# Patient Record
Sex: Female | Born: 1960
Health system: Southern US, Community
[De-identification: ages and names within clinical notes are randomized; demographics above are authoritative.]

## PROBLEM LIST (undated history)

## (undated) DIAGNOSIS — T7840XA Allergy, unspecified, initial encounter: Secondary | ICD-10-CM

## (undated) DIAGNOSIS — F419 Anxiety disorder, unspecified: Secondary | ICD-10-CM

## (undated) DIAGNOSIS — Z800 Family history of malignant neoplasm of digestive organs: Secondary | ICD-10-CM

## (undated) DIAGNOSIS — D126 Benign neoplasm of colon, unspecified: Secondary | ICD-10-CM

## (undated) DIAGNOSIS — E559 Vitamin D deficiency, unspecified: Secondary | ICD-10-CM

## (undated) DIAGNOSIS — Z205 Contact with and (suspected) exposure to viral hepatitis: Secondary | ICD-10-CM

## (undated) DIAGNOSIS — E781 Pure hyperglyceridemia: Secondary | ICD-10-CM

## (undated) HISTORY — DX: Contact with and (suspected) exposure to viral hepatitis: Z20.5

## (undated) HISTORY — DX: Allergy, unspecified, initial encounter: T78.40XA

## (undated) HISTORY — DX: Benign neoplasm of colon, unspecified: D12.6

## (undated) HISTORY — DX: Pure hyperglyceridemia: E78.1

## (undated) HISTORY — PX: COLONOSCOPY: SHX174

## (undated) HISTORY — DX: Vitamin D deficiency, unspecified: E55.9

## (undated) HISTORY — DX: Family history of malignant neoplasm of digestive organs: Z80.0

## (undated) HISTORY — DX: Anxiety disorder, unspecified: F41.9

---

## 1982-10-30 HISTORY — PX: CHOLECYSTECTOMY: SHX55

## 1999-04-15 ENCOUNTER — Encounter (INDEPENDENT_AMBULATORY_CARE_PROVIDER_SITE_OTHER): Payer: Self-pay

## 1999-04-15 ENCOUNTER — Other Ambulatory Visit: Admission: RE | Admit: 1999-04-15 | Discharge: 1999-04-15 | Payer: Self-pay | Admitting: Obstetrics and Gynecology

## 2002-10-29 ENCOUNTER — Encounter: Admission: RE | Admit: 2002-10-29 | Discharge: 2002-10-29 | Payer: Self-pay | Admitting: Family Medicine

## 2002-10-29 ENCOUNTER — Encounter: Payer: Self-pay | Admitting: Family Medicine

## 2003-07-31 ENCOUNTER — Encounter: Admission: RE | Admit: 2003-07-31 | Discharge: 2003-07-31 | Payer: Self-pay | Admitting: Family Medicine

## 2003-07-31 ENCOUNTER — Encounter: Payer: Self-pay | Admitting: Family Medicine

## 2005-01-18 ENCOUNTER — Other Ambulatory Visit: Admission: RE | Admit: 2005-01-18 | Discharge: 2005-01-18 | Payer: Self-pay | Admitting: Family Medicine

## 2006-07-18 ENCOUNTER — Ambulatory Visit: Payer: Self-pay | Admitting: Family Medicine

## 2006-11-13 ENCOUNTER — Ambulatory Visit: Payer: Self-pay | Admitting: Family Medicine

## 2007-02-14 ENCOUNTER — Ambulatory Visit: Payer: Self-pay | Admitting: Family Medicine

## 2007-03-21 ENCOUNTER — Other Ambulatory Visit: Admission: RE | Admit: 2007-03-21 | Discharge: 2007-03-21 | Payer: Self-pay | Admitting: Family Medicine

## 2007-03-21 ENCOUNTER — Ambulatory Visit: Payer: Self-pay | Admitting: Family Medicine

## 2007-05-06 ENCOUNTER — Ambulatory Visit: Payer: Self-pay | Admitting: Family Medicine

## 2007-06-11 ENCOUNTER — Ambulatory Visit: Payer: Self-pay | Admitting: Family Medicine

## 2007-06-19 ENCOUNTER — Ambulatory Visit: Payer: Self-pay | Admitting: Family Medicine

## 2007-11-07 ENCOUNTER — Ambulatory Visit: Payer: Self-pay | Admitting: Family Medicine

## 2007-12-31 ENCOUNTER — Ambulatory Visit: Payer: Self-pay | Admitting: Family Medicine

## 2008-03-02 ENCOUNTER — Ambulatory Visit: Payer: Self-pay | Admitting: Family Medicine

## 2008-04-06 ENCOUNTER — Ambulatory Visit: Payer: Self-pay | Admitting: Family Medicine

## 2008-04-06 ENCOUNTER — Other Ambulatory Visit: Admission: RE | Admit: 2008-04-06 | Discharge: 2008-04-06 | Payer: Self-pay | Admitting: Family Medicine

## 2008-10-27 ENCOUNTER — Ambulatory Visit: Payer: Self-pay | Admitting: Family Medicine

## 2008-12-02 ENCOUNTER — Ambulatory Visit: Payer: Self-pay | Admitting: Family Medicine

## 2009-05-30 DIAGNOSIS — E559 Vitamin D deficiency, unspecified: Secondary | ICD-10-CM

## 2009-05-30 HISTORY — DX: Vitamin D deficiency, unspecified: E55.9

## 2009-06-01 ENCOUNTER — Ambulatory Visit: Payer: Self-pay | Admitting: Family Medicine

## 2009-06-01 ENCOUNTER — Other Ambulatory Visit: Admission: RE | Admit: 2009-06-01 | Discharge: 2009-06-01 | Payer: Self-pay | Admitting: Family Medicine

## 2009-12-17 ENCOUNTER — Ambulatory Visit: Payer: Self-pay | Admitting: Family Medicine

## 2010-03-10 ENCOUNTER — Ambulatory Visit: Payer: Self-pay | Admitting: Family Medicine

## 2010-09-08 ENCOUNTER — Other Ambulatory Visit: Admission: RE | Admit: 2010-09-08 | Discharge: 2010-09-08 | Payer: Self-pay | Admitting: Family Medicine

## 2010-09-08 ENCOUNTER — Ambulatory Visit: Payer: Self-pay | Admitting: Family Medicine

## 2010-11-19 ENCOUNTER — Encounter: Payer: Self-pay | Admitting: Family Medicine

## 2011-01-05 ENCOUNTER — Ambulatory Visit: Payer: Self-pay | Admitting: Family Medicine

## 2011-04-05 ENCOUNTER — Encounter: Payer: Self-pay | Admitting: Family Medicine

## 2011-04-05 ENCOUNTER — Ambulatory Visit (INDEPENDENT_AMBULATORY_CARE_PROVIDER_SITE_OTHER): Payer: BC Managed Care – PPO | Admitting: Family Medicine

## 2011-04-05 VITALS — BP 128/80 | HR 80 | Ht 66.0 in | Wt 189.0 lb

## 2011-04-05 DIAGNOSIS — F411 Generalized anxiety disorder: Secondary | ICD-10-CM

## 2011-04-05 DIAGNOSIS — J45909 Unspecified asthma, uncomplicated: Secondary | ICD-10-CM

## 2011-04-05 DIAGNOSIS — J45998 Other asthma: Secondary | ICD-10-CM

## 2011-04-05 DIAGNOSIS — J309 Allergic rhinitis, unspecified: Secondary | ICD-10-CM | POA: Insufficient documentation

## 2011-04-05 DIAGNOSIS — E781 Pure hyperglyceridemia: Secondary | ICD-10-CM

## 2011-04-05 DIAGNOSIS — Z23 Encounter for immunization: Secondary | ICD-10-CM

## 2011-04-05 MED ORDER — BUDESONIDE-FORMOTEROL FUMARATE 160-4.5 MCG/ACT IN AERO: 2.0000 | INHALATION_SPRAY | Freq: Two times a day (BID) | RESPIRATORY_TRACT | Status: DC

## 2011-04-05 MED ORDER — ALPRAZOLAM 0.5 MG PO TABS: 0.5000 mg | ORAL_TABLET | Freq: Three times a day (TID) | ORAL | Status: DC | PRN

## 2011-04-05 NOTE — Progress Notes (Signed)
Subjective:    Patient ID: Hannah Werner, female    DOB: [birth year], 50 y.o.   MRN: [id number]  HPI Patient presents for f/u on her asthma.  She will be out of her Symbicort in the next few days.  Also needs refill on Singulair--chart was reviewed though and she was given rx for year supply in November.  She usually uses the Symbicort just 1 puff in the morning, but increases to BID during allergy season (ie now).  Hasn't needed to use her rescue inhaler in a month, when pollen counts were very high.    Anxiety is doing very well with the Zoloft.  Rarely needs alprazolam, last used it related to her mother's recent death.  Only has 1 tablet left and would like a refill.  Last rx was #30 in November.  Past Medical History  Diagnosis Date  . Asthma   . Unspecified vitamin D deficiency 05/2009  . Allergy     seasonal  . Pure hyperglyceridemia   . Anxiety     Past Surgical History  Procedure Date  . Cholecystectomy     History   Social History  . Marital Status: Married    Spouse Name: N/A    Number of Children: N/A  . Years of Education: N/A   Occupational History  . Not on file.   Social History Main Topics  . Smoking status: Never Smoker   . Smokeless tobacco: Never Used  . Alcohol Use: Yes     1 beer or mixed drink 3 times a week  . Drug Use: No  . Sexually Active: Yes    Birth Control/ Protection: Pill   Other Topics Concern  . Not on file   Social History Narrative  . No narrative on file    Family History  Problem Relation Age of Onset  . Diabetes Mother   . Cancer Mother 66    colon cancer  . Heart disease Father     CABG @69   . Hyperlipidemia Father   . Diabetes Sister   . Stroke Maternal Grandfather   . Asthma Neg Hx   . Diabetes Sister     Current outpatient prescriptions:Albuterol Sulfate (PROAIR HFA IN), Inhale 1-2 puffs into the lungs as needed.  , Disp: , Rfl: ;  ALPRAZolam (XANAX) 0.5 MG tablet, Take 0.5 mg by mouth as needed.  , Disp: ,  Rfl: ;  budesonide-formoterol (SYMBICORT) 160-4.5 MCG/ACT inhaler, Inhale 1 puff into the lungs 2 (two) times daily. , Disp: , Rfl: ;  montelukast (SINGULAIR) 10 MG tablet, Take 10 mg by mouth at bedtime.  , Disp: , Rfl:  norethindrone-ethinyl estradiol (TRIPHASIL) 0.5/0.75/1-35 MG-MCG per tablet, Take 1 tablet by mouth daily.  , Disp: , Rfl: ;  sertraline (ZOLOFT) 50 MG tablet, Take 50 mg by mouth daily.  , Disp: , Rfl:   Allergies  Allergen Reactions  . Erythromycin Nausea And Vomiting    Review of Systems Denies fevers, headaches (occasionally from eye strain on computer, went to eye doctor in February and got new glasses).  Allergies have been controlled.  Denies cough, heartburn/reflux (gone since she lost 15 pounds).  No diarrhea, blood in stools, or other concerns    Objective:   Physical Exam  Well developed, well nourished patient, in no distress BP 138/88  Pulse 80  Ht 5\' 6"  (1.676 m)  Wt 189 lb (85.73 kg)  BMI 30.51 kg/m2  LMP 10/30/2010 Neck: No lymphadenopathy, no carotid bruit, borderline thyroid  size, no nodules palpable Heart:  Regular rate and rhythm, no murmurs, rubs, gallops or ectopy Lungs:  Clear bilaterally, without wheezes, rales or ronchi Abdomen:  Soft, nontender, nondistended, no hepatosplenomegaly or masses, normal bowel sounds Extremities:  No clubbing, cyanosis or edema, 2+ pulses.  Neuro:  Alert and oriented x 3, cranial nerves grossly intact.  DTR's 2+ and symmetric.  Normal strength and sensation Back:  No spine or CVA tenderness Skin: no rashes or suspicious lesions Psych:  Normal mood, affect, hygiene and grooming, normal speech, eye contact     Assessment & Plan:   1. Asthma in remission  budesonide-formoterol (SYMBICORT) 160-4.5 MCG/ACT inhaler   Doing well.  May continue with 1 puff BID Symbicort, increase to 2p BID if needed  2. Anxiety state, unspecified  ALPRAZolam (XANAX) 0.5 MG tablet   Well controlled  3. Allergic rhinitis, cause  unspecified    4. Pure hyperglyceridemia     doing well per 08/2010 labs.  Consider using 3000mg  of omega 3 fish oil daily  5. Need for pneumococcal vaccination  Pneumococcal polysaccharide vaccine 23-valent greater than or equal to 2yo subcutaneous/IM  6. Need for diphtheria-tetanus-pertussis (Tdap) vaccine  Tdap vaccine greater than or equal to 7yo IM   Orders Placed This Encounter  Procedures  . Tdap vaccine greater than or equal to 7yo IM  . Pneumococcal polysaccharide vaccine 23-valent greater than or equal to 2yo subcutaneous/IM   F/u in November for CPE

## 2011-08-24 ENCOUNTER — Telehealth: Payer: Self-pay | Admitting: Family Medicine

## 2011-08-24 DIAGNOSIS — Z309 Encounter for contraceptive management, unspecified: Secondary | ICD-10-CM

## 2011-08-24 MED ORDER — LEVONORG-ETH ESTRAD TRIPHASIC PO TABS: 1.0000 | ORAL_TABLET | Freq: Every day | ORAL | Status: DC

## 2011-08-24 MED ORDER — NORETHIN-ETH ESTRAD TRIPHASIC 0.5/0.75/1-35 MG-MCG PO TABS: 1.0000 | ORAL_TABLET | Freq: Every day | ORAL | Status: DC

## 2011-08-24 NOTE — Telephone Encounter (Signed)
Called in triphasil #1 pack with 1 refill as pt has CPE on 09/28/11 with Dr.Knapp. Pt notified.

## 2011-09-25 ENCOUNTER — Telehealth: Payer: Self-pay | Admitting: Family Medicine

## 2011-09-25 DIAGNOSIS — F411 Generalized anxiety disorder: Secondary | ICD-10-CM

## 2011-09-25 MED ORDER — SERTRALINE HCL 50 MG PO TABS: 50.0000 mg | ORAL_TABLET | Freq: Every day | ORAL | Status: DC

## 2011-09-25 NOTE — Telephone Encounter (Signed)
E-rx'd.  Has CPE later this week

## 2011-09-28 ENCOUNTER — Encounter: Payer: Self-pay | Admitting: Family Medicine

## 2011-09-28 ENCOUNTER — Ambulatory Visit (INDEPENDENT_AMBULATORY_CARE_PROVIDER_SITE_OTHER): Payer: BC Managed Care – PPO | Admitting: Family Medicine

## 2011-09-28 VITALS — BP 130/74 | HR 84 | Ht 66.0 in | Wt 196.0 lb

## 2011-09-28 DIAGNOSIS — J45909 Unspecified asthma, uncomplicated: Secondary | ICD-10-CM

## 2011-09-28 DIAGNOSIS — F411 Generalized anxiety disorder: Secondary | ICD-10-CM

## 2011-09-28 DIAGNOSIS — J45998 Other asthma: Secondary | ICD-10-CM

## 2011-09-28 DIAGNOSIS — R002 Palpitations: Secondary | ICD-10-CM

## 2011-09-28 DIAGNOSIS — R319 Hematuria, unspecified: Secondary | ICD-10-CM

## 2011-09-28 DIAGNOSIS — Z0000 Encounter for general adult medical examination without abnormal findings: Secondary | ICD-10-CM

## 2011-09-28 DIAGNOSIS — Z309 Encounter for contraceptive management, unspecified: Secondary | ICD-10-CM

## 2011-09-28 DIAGNOSIS — E781 Pure hyperglyceridemia: Secondary | ICD-10-CM

## 2011-09-28 LAB — POCT URINALYSIS DIPSTICK
Ketones, UA: NEGATIVE
Protein, UA: NEGATIVE
Spec Grav, UA: <=1.005
pH, UA: 7

## 2011-09-28 LAB — CBC WITH DIFFERENTIAL/PLATELET
Basophils Absolute: 0 10*3/uL (ref 0.0–0.1)
Basophils Relative: 1 % (ref 0–1)
Eosinophils Relative: 4 % (ref 0–5)
Lymphocytes Relative: 22 % (ref 12–46)
MCHC: 33.4 g/dL (ref 30.0–36.0)
Neutro Abs: 5.7 10*3/uL (ref 1.7–7.7)
Platelets: 314 10*3/uL (ref 150–400)
RDW: 12.9 % (ref 11.5–15.5)
WBC: 8.1 10*3/uL (ref 4.0–10.5)

## 2011-09-28 LAB — COMPREHENSIVE METABOLIC PANEL
ALT: 14 U/L (ref 0–35)
AST: 16 U/L (ref 0–37)
CO2: 23 mEq/L (ref 19–32)
Calcium: 9.2 mg/dL (ref 8.4–10.5)
Chloride: 104 mEq/L (ref 96–112)
Potassium: 4.8 mEq/L (ref 3.5–5.3)
Sodium: 136 mEq/L (ref 135–145)
Total Protein: 6.7 g/dL (ref 6.0–8.3)

## 2011-09-28 LAB — LIPID PANEL
HDL: 73 mg/dL (ref >39)
LDL Cholesterol: 81 mg/dL (ref 0–99)
Triglycerides: 116 mg/dL (ref <150)

## 2011-09-28 LAB — TSH: TSH: 1.337 u[IU]/mL (ref 0.350–4.500)

## 2011-09-28 MED ORDER — MONTELUKAST SODIUM 10 MG PO TABS: 10.0000 mg | ORAL_TABLET | Freq: Every day | ORAL | Status: DC

## 2011-09-28 MED ORDER — ALBUTEROL SULFATE HFA 108 (90 BASE) MCG/ACT IN AERS: 2.0000 | INHALATION_SPRAY | Freq: Four times a day (QID) | RESPIRATORY_TRACT | Status: DC | PRN

## 2011-09-28 MED ORDER — LEVONORG-ETH ESTRAD TRIPHASIC PO TABS: 1.0000 | ORAL_TABLET | Freq: Every day | ORAL | Status: DC

## 2011-09-28 MED ORDER — SERTRALINE HCL 50 MG PO TABS: 50.0000 mg | ORAL_TABLET | Freq: Every day | ORAL | Status: DC

## 2011-09-28 MED ORDER — BUDESONIDE-FORMOTEROL FUMARATE 160-4.5 MCG/ACT IN AERO: 2.0000 | INHALATION_SPRAY | Freq: Two times a day (BID) | RESPIRATORY_TRACT | Status: DC

## 2011-09-28 MED ORDER — ALPRAZOLAM 0.5 MG PO TABS: 0.5000 mg | ORAL_TABLET | Freq: Three times a day (TID) | ORAL | Status: DC | PRN

## 2011-09-28 NOTE — Progress Notes (Signed)
Hannah Werner is a 50 y.o. female who presents for a complete physical.  She has the following concerns: Heart palpitations--started a couple of weeks ago.  Had been drinking more coffee than usual, she since cut back.  She took 1/2 xanax which helped diminish the symptoms/palpitations.  Last took the xanax 4-5 days ago, when she ran out, and has been doing pretty well since then (since she cut back on caffeine).  Otherwise, anxiety had been well controlled, only needing xanax once a month, if that.  Asthma--doing well.  Hasn't needed rescue inhaler in a couple of months.  Using Symbicort 1 puff twice daily, but increased to 2 puffs during allergy season.  Doing well with this combination of symbicort and singulair.  Immunization History  Administered Date(s) Administered  . Influenza Split 08/14/2011  . Pneumococcal Polysaccharide 04/05/2011  . Tdap 04/05/2011   Last Pap smear: 08/2010 Last mammogram: last year, and is scheduled for one Last colonoscopy: poor prep 2010, never went back to have it redone (Dr. Loreta Ave).   Last DEXA: never Dentist: twice early Ophtho: yearly Exercise: walking the dogs daily, at least 30 minutes  Past Medical History  Diagnosis Date  . Asthma   . Unspecified vitamin D deficiency 05/2009  . Allergy     seasonal  . Pure hyperglyceridemia   . Anxiety     Past Surgical History  Procedure Date  . Cholecystectomy   . Colonoscopy 08/2009    inadequate prep.  Rec re-do with 3 day prep within 6 mos (Dr. Loreta Ave)    History   Social History  . Marital Status: Married    Spouse Name: N/A    Number of Children: 0  . Years of Education: N/A   Occupational History  . Fish farm manager   Social History Main Topics  . Smoking status: Never Smoker   . Smokeless tobacco: Never Used  . Alcohol Use: Yes     1 beer or mixed drink 3 times a week  . Drug Use: No  . Sexually Active: Yes -- Female partner(s)    Birth Control/  Protection: Pill   Other Topics Concern  . Not on file   Social History Narrative   Lives with her husband, 15 cats and 3 dogs    Family History  Problem Relation Age of Onset  . Diabetes Mother   . Cancer Mother 52    colon cancer  . Heart disease Father     CABG @69   . Hyperlipidemia Father   . Diabetes Sister   . Stroke Maternal Grandfather   . Asthma Neg Hx   . Diabetes Sister    Current Outpatient Prescriptions on File Prior to Visit  Medication Sig Dispense Refill  . DISCONTD: budesonide-formoterol (SYMBICORT) 160-4.5 MCG/ACT inhaler Inhale 2 puffs into the lungs 2 (two) times daily.  1 Inhaler  5  . DISCONTD: levonorgestrel-ethinyl estradiol (ENPRESSE,TRIVORA) tablet Take 1 tablet by mouth daily.  1 Package  1  . DISCONTD: montelukast (SINGULAIR) 10 MG tablet Take 10 mg by mouth at bedtime.         Allergies  Allergen Reactions  . Erythromycin Nausea And Vomiting   ROS: The patient denies anorexia, fever, headaches,  vision changes, decreased hearing, ear pain, sore throat, breast concerns, chest pain, dizziness, syncope, dyspnea on exertion, cough, swelling, nausea, vomiting, diarrhea, constipation, abdominal pain, melena, hematochezia, hematuria, incontinence, dysuria, irregular menstrual cycles, vaginal discharge, odor or itch, genital lesions, joint pains,  numbness, tingling, weakness, tremor, suspicious skin lesions, depression, abnormal bleeding/bruising, or enlarged lymph nodes. +mild hot flashes when on placebo birth control pills +acid reflux symptoms recently.  Regained some weight recently.  +palpitations, see HPI. Some finger stiffness, mild pain. + anxiety, sleeps okay at night.  PHYSICAL EXAM: BP 130/74  Pulse 84  Ht 5\' 6"  (1.676 m)  Wt 196 lb (88.905 kg)  BMI 31.64 kg/m2  LMP 05/15/2011  General Appearance:    Alert, cooperative, no distress, appears stated age  Head:    Normocephalic, without obvious abnormality, atraumatic  Eyes:    PERRL,  conjunctiva/corneas clear, EOM's intact, fundi    benign  Ears:    Normal TM's and external ear canals  Nose:   Nares normal, mucosa normal, no drainage or sinus   tenderness  Throat:   Lips, mucosa, and tongue normal; teeth and gums normal  Neck:   Supple, no lymphadenopathy;  thyroid:  no   enlargement/tenderness/nodules; no carotid   bruit or JVD  Back:    Spine nontender, no curvature, ROM normal, no CVA     tenderness  Lungs:     Clear to auscultation bilaterally without wheezes, rales or     ronchi; respirations unlabored  Chest Wall:    No tenderness or deformity   Heart:    Regular rate and rhythm, S1 and S2 normal, no murmur, rub   or gallop  Breast Exam:    No tenderness, masses, or nipple discharge or inversion.      No axillary lymphadenopathy  Abdomen:     Soft, non-tender, nondistended, normoactive bowel sounds,    no masses, no hepatosplenomegaly  Genitalia:    Normal external genitalia without lesions.  BUS and vagina normal; no cervical motion tenderness. No abnormal vaginal discharge.  Uterus and adnexa not enlarged, nontender, no masses.  Pap not performed  Rectal:    Normal tone, no masses or tenderness; guaiac negative stool; nonbleeding external hemorrhoid  Extremities:   No clubbing, cyanosis or edema  Pulses:   2+ and symmetric all extremities  Skin:   Skin color, texture, turgor normal, no rashes or lesions  Lymph nodes:   Cervical, supraclavicular, and axillary nodes normal  Neurologic:   CNII-XII intact, normal strength, sensation and gait; reflexes 2+ and symmetric throughout          Psych:   Normal mood, affect, hygiene and grooming.    ASSESSMENT/PLAN: 1. Routine general medical examination at a health care facility  POCT Urinalysis Dipstick, Visual acuity screening  2. Hematuria  Urine Culture  3. Palpitations  TSH, Comprehensive metabolic panel, CBC with Differential  4. Anxiety state, unspecified  sertraline (ZOLOFT) 50 MG tablet, ALPRAZolam (XANAX) 0.5  MG tablet  5. Asthma in remission  montelukast (SINGULAIR) 10 MG tablet, budesonide-formoterol (SYMBICORT) 160-4.5 MCG/ACT inhaler, albuterol (PROVENTIL HFA;VENTOLIN HFA) 108 (90 BASE) MCG/ACT inhaler  6. Pure hyperglyceridemia  Lipid panel  7. Contraception management  levonorgestrel-ethinyl estradiol (ENPRESSE,TRIVORA) tablet  8. Asthma in remission  montelukast (SINGULAIR) 10 MG tablet, budesonide-formoterol (SYMBICORT) 160-4.5 MCG/ACT inhaler, albuterol (PROVENTIL HFA;VENTOLIN HFA) 108 (90 BASE) MCG/ACT inhaler   Doing well.  May continue with 1 puff BID Symbicort, increase to 2p BID if needed  9. Anxiety state, unspecified  sertraline (ZOLOFT) 50 MG tablet, ALPRAZolam (XANAX) 0.5 MG tablet   Well controlled   Asthma--well controlled Anxiety--worse recently, better now.  Cut back on caffeine.  Exercise daily, weight loss recommended.  Discussed monthly self breast exams and yearly mammograms  after the age of 63; at least 30 minutes of aerobic activity at least 5 days/week; proper sunscreen use reviewed; healthy diet, including goals of calcium and vitamin D intake and alcohol recommendations (less than or equal to 1 drink/day) reviewed; regular seatbelt use; changing batteries in smoke detectors.  Immunization recommendations discussed.  Colonoscopy recommendations reviewed

## 2011-09-28 NOTE — Patient Instructions (Signed)

## 2011-10-01 LAB — URINE CULTURE

## 2011-10-04 ENCOUNTER — Other Ambulatory Visit: Payer: Self-pay | Admitting: *Deleted

## 2011-10-04 ENCOUNTER — Encounter: Payer: Self-pay | Admitting: Internal Medicine

## 2011-10-04 MED ORDER — FLUCONAZOLE 150 MG PO TABS: 150.0000 mg | ORAL_TABLET | Freq: Once | ORAL | Status: DC

## 2011-10-04 MED ORDER — NITROFURANTOIN MONOHYD MACRO 100 MG PO CAPS: 100.0000 mg | ORAL_CAPSULE | Freq: Two times a day (BID) | ORAL | Status: DC

## 2011-12-18 ENCOUNTER — Ambulatory Visit (INDEPENDENT_AMBULATORY_CARE_PROVIDER_SITE_OTHER): Payer: BC Managed Care – PPO | Admitting: Family Medicine

## 2011-12-18 ENCOUNTER — Encounter: Payer: Self-pay | Admitting: Family Medicine

## 2011-12-18 VITALS — BP 120/74 | HR 80 | Temp 98.3°F | Ht 66.0 in | Wt 194.0 lb

## 2011-12-18 DIAGNOSIS — J209 Acute bronchitis, unspecified: Secondary | ICD-10-CM

## 2011-12-18 DIAGNOSIS — J45909 Unspecified asthma, uncomplicated: Secondary | ICD-10-CM

## 2011-12-18 DIAGNOSIS — Z309 Encounter for contraceptive management, unspecified: Secondary | ICD-10-CM

## 2011-12-18 DIAGNOSIS — F411 Generalized anxiety disorder: Secondary | ICD-10-CM

## 2011-12-18 DIAGNOSIS — J45998 Other asthma: Secondary | ICD-10-CM

## 2011-12-18 MED ORDER — MONTELUKAST SODIUM 10 MG PO TABS: 10.0000 mg | ORAL_TABLET | Freq: Every day | ORAL | Status: DC

## 2011-12-18 MED ORDER — AZITHROMYCIN 250 MG PO TABS: ORAL_TABLET | ORAL | Status: AC

## 2011-12-18 MED ORDER — BUDESONIDE-FORMOTEROL FUMARATE 160-4.5 MCG/ACT IN AERO: 2.0000 | INHALATION_SPRAY | Freq: Two times a day (BID) | RESPIRATORY_TRACT | Status: DC

## 2011-12-18 MED ORDER — LEVONORG-ETH ESTRAD TRIPHASIC PO TABS: 1.0000 | ORAL_TABLET | Freq: Every day | ORAL | Status: DC

## 2011-12-18 MED ORDER — FLUCONAZOLE 150 MG PO TABS: 150.0000 mg | ORAL_TABLET | Freq: Once | ORAL | Status: AC

## 2011-12-18 MED ORDER — SERTRALINE HCL 50 MG PO TABS: 50.0000 mg | ORAL_TABLET | Freq: Every day | ORAL | Status: DC

## 2011-12-18 NOTE — Progress Notes (Signed)
Chief complaint: congestion and runny started Wed, then cough started Fri evening. Having "waves" of hot , then chills  HPI: Began 5 days ago with some runny nose, then moved right into her chest.  Having cough productive of yellow phlegm, thick.  Cough gets drier throughout the day, but a lot of mucus in the morning, and some throughout the day.  Having some shortness of breath with walking.  Started using her inhaler on Friday (3 days ago), using it as rescue med 2-3 times/day over the weekend.  Has been using her Symbicort regularly.  +sick contact (husband was sick last week).  Has been using Tylenol Cold and Flu and Mucinex.  Needs new rx's for all of her chronic medications, switching to [facility] for mail order.  Moods have been good on current dose of zoloft  Past Medical History  Diagnosis Date  . Asthma   . Unspecified vitamin D deficiency 05/2009  . Pure hyperglyceridemia   . Anxiety   . Allergy     seasonal  . Family hx of colon cancer     mom  . CAD (coronary artery disease)     family history, father 22    Past Surgical History  Procedure Date  . Colonoscopy 08/2009    inadequate prep.  Rec re-do with 3 day prep within 6 mos (Dr. Loreta Ave)  . Cholecystectomy 84    History   Social History  . Marital Status: Married    Spouse Name: N/A    Number of Children: 0  . Years of Education: N/A   Occupational History  . Fish farm manager   Social History Main Topics  . Smoking status: Never Smoker   . Smokeless tobacco: Never Used  . Alcohol Use: Yes     1 beer or mixed drink 3 times a week  . Drug Use: No  . Sexually Active: Yes -- Female partner(s)    Birth Control/ Protection: Pill   Other Topics Concern  . Not on file   Social History Narrative   Lives with her husband, 15 cats and 3 dogs    Family History  Problem Relation Age of Onset  . Diabetes Mother   . Cancer Mother 31    colon cancer  . Heart disease Father     CABG  @69   . Hyperlipidemia Father   . Diabetes Sister   . Stroke Maternal Grandfather   . Asthma Neg Hx   . Diabetes Sister    Current Outpatient Prescriptions on File Prior to Visit  Medication Sig Dispense Refill  . albuterol (PROVENTIL HFA;VENTOLIN HFA) 108 (90 BASE) MCG/ACT inhaler Inhale 2 puffs into the lungs every 6 (six) hours as needed for wheezing.  18 g  1  . ALPRAZolam (XANAX) 0.5 MG tablet Take 1 tablet (0.5 mg total) by mouth 3 (three) times daily as needed for anxiety.  30 tablet  0  . DISCONTD: budesonide-formoterol (SYMBICORT) 160-4.5 MCG/ACT inhaler Inhale 2 puffs into the lungs 2 (two) times daily.  1 Inhaler  5  . DISCONTD: levonorgestrel-ethinyl estradiol (ENPRESSE,TRIVORA) tablet Take 1 tablet by mouth daily.  1 Package  2  . DISCONTD: montelukast (SINGULAIR) 10 MG tablet Take 1 tablet (10 mg total) by mouth at bedtime.  90 tablet  1    Allergies  Allergen Reactions  . Erythromycin Nausea And Vomiting   ROS: Denies fevers/chills (just mild), small wave of nausea yesterday after coughing, no other nausea, vomiting, diarrhea.  Has good appetite.  Denies rash, bleeding.  PHYSICAL EXAM: BP 120/74  Pulse 80  Temp(Src) 98.3 F (36.8 C) (Oral)  Ht 5\' 6"  (1.676 m)  Wt 194 lb (87.998 kg)  BMI 31.31 kg/m2  LMP 11/27/2011 Well developed, pleasant female in no distress.  Speaking comfortably in full sentences, in no distress.  Occasional spasm of deep cough HEENT:  PERRL, EOMI, conjunctiva clear.  TM's and EAC's normal.  Nasal mucosa with mild edema, clear mucus.  Mild tenderness over both maxillary sinuses.  OP clear without erythema or lesions Neck: no lymphadenopathy or mass Heart: regular rate and rhythm without murmur Lungs: coarse breath sounds.  Some wheezing and deep cough with forced expiration.  Good air movement.  Extremities: no edema  Skin: no rash Psych: normal mood, affect, hygiene and grooming  ASSESSMENT/PLAN: 1. Bronchitis, acute, with bronchospasm   azithromycin (ZITHROMAX) 250 MG tablet  2. Asthma in remission  montelukast (SINGULAIR) 10 MG tablet, budesonide-formoterol (SYMBICORT) 160-4.5 MCG/ACT inhaler  3. Anxiety state, unspecified  sertraline (ZOLOFT) 50 MG tablet  4. Contraception management  levonorgestrel-ethinyl estradiol (ENPRESSE,TRIVORA) tablet    Continue sinus medications as needed.  Continue Mucinex to loosen phlegm.  Consider sinus rinses or Neti-pot to help with sinus congestion.  If symptoms do not completely resolve by day 10, call for refill to start on day 11.  If symptoms are worse in 5-7 days (rather than improving), then call to have antibiotics changed.  Continue to use inhaler every 4-6 hours if needed for shortness of breath and coughing, but expect your need for that to improve in the next few days, as the antibiotics start working.  If you continue to need inhaler every 4 hours, and not improving, likely need steroids in addition to the antibiotics.  Call or follow up here if that occurs.   Start the diflucan if symptoms of yeast infection develop (vaginal discharge and itching)

## 2011-12-18 NOTE — Patient Instructions (Signed)
Continue sinus medications as needed.  Continue Mucinex to loosen phlegm.  Consider sinus rinses or Neti-pot to help with sinus congestion.  If symptoms do not completely resolve by day 10, call for refill to start on day 11.  If symptoms are worse in 5-7 days (rather than improving), then call to have antibiotics changed.  Continue to use inhaler every 4-6 hours if needed for shortness of breath and coughing, but expect your need for that to improve in the next few days, as the antibiotics start working.  If you continue to need inhaler every 4 hours, and not improving, likely need steroids in addition to the antibiotics.  Call or follow up here if that occurs.  Start the diflucan if symptoms of yeast infection develop (vaginal discharge and itching)

## 2012-03-28 ENCOUNTER — Ambulatory Visit (INDEPENDENT_AMBULATORY_CARE_PROVIDER_SITE_OTHER): Payer: BC Managed Care – PPO | Admitting: Family Medicine

## 2012-03-28 ENCOUNTER — Encounter: Payer: Self-pay | Admitting: Family Medicine

## 2012-03-28 VITALS — BP 124/86 | HR 94 | Wt 191.0 lb

## 2012-03-28 DIAGNOSIS — Z309 Encounter for contraceptive management, unspecified: Secondary | ICD-10-CM

## 2012-03-28 DIAGNOSIS — J309 Allergic rhinitis, unspecified: Secondary | ICD-10-CM

## 2012-03-28 DIAGNOSIS — F411 Generalized anxiety disorder: Secondary | ICD-10-CM

## 2012-03-28 DIAGNOSIS — J45909 Unspecified asthma, uncomplicated: Secondary | ICD-10-CM

## 2012-03-28 DIAGNOSIS — J45998 Other asthma: Secondary | ICD-10-CM

## 2012-03-28 MED ORDER — LEVONORG-ETH ESTRAD TRIPHASIC PO TABS: 1.0000 | ORAL_TABLET | Freq: Every day | ORAL | Status: DC

## 2012-03-28 MED ORDER — ALPRAZOLAM 0.5 MG PO TABS: 0.5000 mg | ORAL_TABLET | Freq: Three times a day (TID) | ORAL | Status: DC | PRN

## 2012-03-28 NOTE — Patient Instructions (Signed)
Continue all your current medications. At your physical in November we will discuss changing birth control pills.   Remember to follow up if anxiety is worsening, or asthma is worsening (ie needing albuterol frequently)  Please call and schedule your colonoscopy!!

## 2012-03-28 NOTE — Progress Notes (Signed)
Chief Complaint  Patient presents with  . Follow-up    anxiety   HPI: Not sleeping too well recently. Mom passed away 1 year ago, and dad isn't doing as well--issues with anxiety, being irrational.  Sisters caring for him in [place], but she worries.  Occasionally takes xanax to help her sleep--if she wakes up 4 am with heart pounding. Anxiety overall well controlled.  Uses xanax a few times/month at most.  Needs refill.  Often only takes 1/2 tablet at a time.  Asthma--doing well.  Needed rescue inhaler a few weeks ago when allergies were flaring, not recently.  Contraceptive management--won't be getting mail order pills in time. Needs local pack.  Having mild hot flashes on placebo week.  Past Medical History  Diagnosis Date  . Asthma   . Unspecified vitamin D deficiency 05/2009  . Pure hyperglyceridemia   . Anxiety   . Allergy     seasonal  . Family hx of colon cancer     mom   Past Surgical History  Procedure Date  . Colonoscopy 08/2009    inadequate prep.  Rec re-do with 3 day prep within 6 mos (Dr. Loreta Ave)  . Cholecystectomy 24   Family History  Problem Relation Age of Onset  . Diabetes Mother   . Cancer Mother 39    colon cancer  . Heart disease Father     CABG @69   . Hyperlipidemia Father   . Anxiety disorder Father   . Diabetes Sister   . Stroke Maternal Grandfather   . Asthma Neg Hx   . Diabetes Sister    Current Outpatient Prescriptions on File Prior to Visit  Medication Sig Dispense Refill  . albuterol (PROVENTIL HFA;VENTOLIN HFA) 108 (90 BASE) MCG/ACT inhaler Inhale 2 puffs into the lungs every 6 (six) hours as needed for wheezing.  18 g  1  . ALPRAZolam (XANAX) 0.5 MG tablet Take 1 tablet (0.5 mg total) by mouth 3 (three) times daily as needed for anxiety.  30 tablet  0  . budesonide-formoterol (SYMBICORT) 160-4.5 MCG/ACT inhaler Inhale 2 puffs into the lungs 2 (two) times daily.  3 Inhaler  2  . levonorgestrel-ethinyl estradiol (ENPRESSE,TRIVORA) tablet Take 1  tablet by mouth daily.  3 Package  2  . montelukast (SINGULAIR) 10 MG tablet Take 1 tablet (10 mg total) by mouth at bedtime.  90 tablet  2  . sertraline (ZOLOFT) 50 MG tablet Take 1 tablet (50 mg total) by mouth daily.  90 tablet  2   Allergies  Allergen Reactions  . Erythromycin Nausea And Vomiting   ROS:  Denies fevers, URI or allergy symptoms, depression, chest pain, shortness of breath, GI complaints, bowel changes, skin rash or other problems.  PHYSICAL EXAM: BP 124/86  Pulse 94  Wt 191 lb (86.637 kg)  SpO2 98% Well developed, pleasant female in no distress Neck: no lymphadenopathy or thyromegaly Heart: regular rate and rhythm Lungs: clear with good air movement Abdomen: soft, nontender, no mass Exremities: no edema Psych: normal mood, affect, hygiene and grooming.  Normal eye contact and speech Neuro: alert and oriented.  Normal gait, CN grossly intact  ASSESSMENT/PLAN: 1. Anxiety state, unspecified  ALPRAZolam (XANAX) 0.5 MG tablet  2. Contraception management  levonorgestrel-ethinyl estradiol (ENPRESSE,TRIVORA) tablet  3. Asthma in remission    4. Allergic rhinitis, cause unspecified     Discussed use of OCP's at this age.  Recommend changing to lower dose pill at her next CPE (already has 3 packs of mail  order on its way).  Consider checking FSH after placebo week, when hot flashes worsening.  Reminded to get colonoscopy--repeat with better prep was recommended in 2010 and hasn't done yet.  +family history of cancer  Anxiety--overall well controlled.  Refill xanax for sporadic/intermittent use.  Continue Zoloft (refilled at last visit)  Asthma--well controlled.  Continue current meds.  F/u 6 months for CPE  25 min visit, more than 1/2 counseling

## 2012-04-24 ENCOUNTER — Ambulatory Visit (INDEPENDENT_AMBULATORY_CARE_PROVIDER_SITE_OTHER): Payer: BC Managed Care – PPO | Admitting: Family Medicine

## 2012-04-24 ENCOUNTER — Encounter: Payer: Self-pay | Admitting: Family Medicine

## 2012-04-24 VITALS — BP 132/90 | HR 96 | Temp 98.1°F | Ht 66.0 in | Wt 192.0 lb

## 2012-04-24 DIAGNOSIS — R42 Dizziness and giddiness: Secondary | ICD-10-CM

## 2012-04-24 LAB — CBC WITH DIFFERENTIAL/PLATELET
Basophils Absolute: 0.1 10*3/uL (ref 0.0–0.1)
Basophils Relative: 1 % (ref 0–1)
Eosinophils Absolute: 0.2 10*3/uL (ref 0.0–0.7)
Eosinophils Relative: 3 % (ref 0–5)
HCT: 40.3 % (ref 36.0–46.0)
Hemoglobin: 14 g/dL (ref 12.0–15.0)
Lymphocytes Relative: 26 % (ref 12–46)
Lymphs Abs: 2.1 10*3/uL (ref 0.7–4.0)
MCH: 30.7 pg (ref 26.0–34.0)
MCHC: 34.7 g/dL (ref 30.0–36.0)
MCV: 88.4 fL (ref 78.0–100.0)
Monocytes Absolute: 0.5 10*3/uL (ref 0.1–1.0)
Monocytes Relative: 6 % (ref 3–12)
Neutro Abs: 5.3 10*3/uL (ref 1.7–7.7)
Neutrophils Relative %: 64 % (ref 43–77)
Platelets: 329 10*3/uL (ref 150–400)
RBC: 4.56 MIL/uL (ref 3.87–5.11)
RDW: 12.6 % (ref 11.5–15.5)
WBC: 8.1 10*3/uL (ref 4.0–10.5)

## 2012-04-24 LAB — COMPREHENSIVE METABOLIC PANEL
CO2: 25 mEq/L (ref 19–32)
Creat: 0.7 mg/dL (ref 0.50–1.10)
Glucose, Bld: 99 mg/dL (ref 70–99)
Total Bilirubin: 0.3 mg/dL (ref 0.3–1.2)
Total Protein: 6.7 g/dL (ref 6.0–8.3)

## 2012-04-24 NOTE — Progress Notes (Signed)
Chief Complaint  Patient presents with  . Dizziness    when she wakes up in the morning she is so hungry that she feels like she is going to pass out. Takes about 15 minutes after she eats to actually get up and get moving. Also having some issues with nausea. Had lunch at noon today-is already hungry. Generally feels really really tired-she has no energy.   HPI:  Feeling swimmy-headed, and somewhat dizzy/lightheaded, as well as increased hunger over the last week and a half.  Has been getting more noticeable and worse.  There may be some stress involved.  It takes about 15 minutes after eating before she feels better, but she does feel much better after eating.  She is constantly drinking water in order to stay hydrated (denies excess thirst, unless she takes benadryl, then gets dry mouth).  Today--Woke up feeling fine, ate breakfast.  After arriving at work, started feeling exausted, light-headed (to the point where she took elevator instead of stairs)--about 45 mins after eating scrambled eggs, spinach burrito.  Has had some increased hunger over the past few weeks.  Denies weight loss.  Denies dieting, skipping meals, or significant changes in habits.  Eating more vegetables fresh from the garden.  Past Medical History  Diagnosis Date  . Asthma   . Unspecified vitamin D deficiency 05/2009  . Pure hyperglyceridemia   . Anxiety   . Allergy     seasonal  . Family hx of colon cancer     mom   Past Surgical History  Procedure Date  . Colonoscopy 08/2009    inadequate prep.  Rec re-do with 3 day prep within 6 mos (Dr. Loreta Ave)  . Cholecystectomy 84   History   Social History  . Marital Status: Married    Spouse Name: N/A    Number of Children: 0  . Years of Education: N/A   Occupational History  . Fish farm manager   Social History Main Topics  . Smoking status: Never Smoker   . Smokeless tobacco: Never Used  . Alcohol Use: Yes     1 beer or mixed  drink 3 times a week  . Drug Use: No  . Sexually Active: Yes -- Female partner(s)    Birth Control/ Protection: Pill   Other Topics Concern  . Not on file   Social History Narrative   Lives with her husband, 15 cats and 3 dogs   Current Outpatient Prescriptions on File Prior to Visit  Medication Sig Dispense Refill  . albuterol (PROVENTIL HFA;VENTOLIN HFA) 108 (90 BASE) MCG/ACT inhaler Inhale 2 puffs into the lungs every 6 (six) hours as needed for wheezing.  18 g  1  . ALPRAZolam (XANAX) 0.5 MG tablet Take 1 tablet (0.5 mg total) by mouth 3 (three) times daily as needed for anxiety.  30 tablet  0  . budesonide-formoterol (SYMBICORT) 160-4.5 MCG/ACT inhaler Inhale 2 puffs into the lungs 2 (two) times daily.  3 Inhaler  2  . levonorgestrel-ethinyl estradiol (ENPRESSE,TRIVORA) tablet Take 1 tablet by mouth daily.  1 Package  0  . montelukast (SINGULAIR) 10 MG tablet Take 1 tablet (10 mg total) by mouth at bedtime.  90 tablet  2  . sertraline (ZOLOFT) 50 MG tablet Take 1 tablet (50 mg total) by mouth daily.  90 tablet  2   Allergies  Allergen Reactions  . Erythromycin Nausea And Vomiting   ROS: Has been on zoloft for about 3 years for  anxiety.  Father was diagnosed with CLL, sisters both have DM, and one's sugars have been high.  Other sister has been having seizures. Feels like her moods are overall adequately controlled with the zoloft. Denies any shortness of breath or wheezing. Took 1/2 xanax last week after a "crappy phone call" from her sister (who has been sick, stresses pt).  Denies fevers.  She feels some fluid in R ear, but otherwise no allergy symptoms, congestion.  Denies chest pain, nausea, vomiting, diarrhea.  Only has had nausea when extremely hungry.  +heartburn recently.  Chart review--normal CBC, lipids, chem and TSH in November.  PHYSICAL EXAM:  BP 132/90  Pulse 96  Temp 98.1 F (36.7 C) (Oral)  Ht 5\' 6"  (1.676 m)  Wt 192 lb (87.091 kg)  BMI 30.99 kg/m2 Well  developed, pleasant overweight female in no distress HEENT:  PERRL, EOMI, conjunctiva clear.  TM's and EACs normal.  OP clear. sinuses notender. Neck: no lymphadenopathy or mass Heart regular rate and rhythm withhout murmur Lungs: clear bilaterally Abdomen: soft, mild epigastric tenderness.  No rebound tenderness or guarding. Negative Murphy sign Extremities: no edema Skin: no rash Neuro--alert and oriented. Normal strength, sensation and gait.  ASSESSMENT/PLAN: 1. Dizziness  Comprehensive metabolic panel, CBC with Differential   Reviewed differential diagnosis CBC and c-met. Trial of prilosec OTC given mild epigastric tenderness and h/o heartburn recently. There also appears to be some component of vertigo (equilibrium problems, room spinning)--trial of meclizine as needed.  Drink plenty of fluids.  Eat meals regularly, rich in protein.  Avoid concentrated sweets (candy bars).  Monitor BP elsewhere--goal <130-135/80-85  Return in 1-2 weeks if ongoing problems

## 2012-04-24 NOTE — Patient Instructions (Addendum)
Trial of prilosec OTC given mild stomach tenderness and heartburn recently. Try meclizine (bonine, or other OTC formulations--ask pharmacist) 12.5-25mg  3 times daily, if needed for dizziness/vertigo.  May cause drowsiness.  Drink plenty of fluids.  Eat meals regularly, rich in protein.  Avoid concentrated sweets (candy bars).  Monitor BP elsewhere--goal <130-135/80-85  Return in 1-2 weeks if ongoing problems

## 2012-04-25 ENCOUNTER — Ambulatory Visit: Payer: BC Managed Care – PPO | Admitting: Family Medicine

## 2012-06-19 ENCOUNTER — Telehealth: Payer: Self-pay | Admitting: Family Medicine

## 2012-06-19 DIAGNOSIS — F411 Generalized anxiety disorder: Secondary | ICD-10-CM

## 2012-06-19 MED ORDER — ALPRAZOLAM 0.5 MG PO TABS: 0.5000 mg | ORAL_TABLET | Freq: Three times a day (TID) | ORAL | Status: DC | PRN

## 2012-06-19 NOTE — Telephone Encounter (Signed)
Pt called wanting refill for generic xanax prn  She has a stressful family event coming up next week.  She needs RX written and mailed.  Suzette Battiest has envelope ready.

## 2012-06-19 NOTE — Telephone Encounter (Signed)
Done.  Please mail.

## 2012-06-19 NOTE — Telephone Encounter (Signed)
Done

## 2012-08-09 ENCOUNTER — Telehealth: Payer: Self-pay | Admitting: Internal Medicine

## 2012-08-09 DIAGNOSIS — Z309 Encounter for contraceptive management, unspecified: Secondary | ICD-10-CM

## 2012-08-09 MED ORDER — LEVONORG-ETH ESTRAD TRIPHASIC PO TABS: 1.0000 | ORAL_TABLET | Freq: Every day | ORAL | Status: DC

## 2012-08-09 NOTE — Telephone Encounter (Signed)
Done (appt sched for 09/2012)

## 2012-09-30 ENCOUNTER — Encounter: Payer: BC Managed Care – PPO | Admitting: Family Medicine

## 2012-10-07 ENCOUNTER — Telehealth: Payer: Self-pay | Admitting: Family Medicine

## 2012-10-07 DIAGNOSIS — F411 Generalized anxiety disorder: Secondary | ICD-10-CM

## 2012-10-07 MED ORDER — SERTRALINE HCL 50 MG PO TABS: 50.0000 mg | ORAL_TABLET | Freq: Every day | ORAL | Status: DC

## 2012-10-07 NOTE — Telephone Encounter (Signed)
Done (appt 11/2012)

## 2012-10-24 ENCOUNTER — Ambulatory Visit (INDEPENDENT_AMBULATORY_CARE_PROVIDER_SITE_OTHER): Payer: BC Managed Care – PPO | Admitting: Family Medicine

## 2012-10-24 ENCOUNTER — Encounter: Payer: Self-pay | Admitting: Family Medicine

## 2012-10-24 VITALS — BP 128/70 | HR 112 | Temp 97.6°F | Wt 194.0 lb

## 2012-10-24 DIAGNOSIS — J45909 Unspecified asthma, uncomplicated: Secondary | ICD-10-CM

## 2012-10-24 DIAGNOSIS — H6690 Otitis media, unspecified, unspecified ear: Secondary | ICD-10-CM

## 2012-10-24 DIAGNOSIS — H6691 Otitis media, unspecified, right ear: Secondary | ICD-10-CM

## 2012-10-24 DIAGNOSIS — J209 Acute bronchitis, unspecified: Secondary | ICD-10-CM

## 2012-10-24 DIAGNOSIS — R05 Cough: Secondary | ICD-10-CM

## 2012-10-24 DIAGNOSIS — J309 Allergic rhinitis, unspecified: Secondary | ICD-10-CM

## 2012-10-24 DIAGNOSIS — J45998 Other asthma: Secondary | ICD-10-CM

## 2012-10-24 MED ORDER — AMOXICILLIN 875 MG PO TABS: 875.0000 mg | ORAL_TABLET | Freq: Two times a day (BID) | ORAL | Status: DC

## 2012-10-24 MED ORDER — MONTELUKAST SODIUM 10 MG PO TABS: 10.0000 mg | ORAL_TABLET | Freq: Every day | ORAL | Status: DC

## 2012-10-24 MED ORDER — FLUCONAZOLE 150 MG PO TABS: 150.0000 mg | ORAL_TABLET | Freq: Once | ORAL | Status: DC

## 2012-10-24 NOTE — Progress Notes (Signed)
  Subjective:    Patient ID: Hannah Werner, female    DOB: Sep 23, 1961, 51 y.o.   MRN: [id number]  HPI She has a 4 day history this started with sore throat followed by nasal congestion, right earache and this morning worsening cough as well as right-sided chest discomfort. She does have underlying asthma and has noted some increased difficulty with wheezing.  Review of Systems     Objective:   Physical Exam alert and slightly toxic appearing. Tympanic membrane on the left is normal, right is dull and red canals are normal. Throat is clear. Tonsils are normal. Neck is supple without adenopathy or thyromegaly. Cardiac exam shows a regular sinus rhythm without murmurs or gallops. Lungs are clear to auscultation. Nasal mucosa is slightly red with no tenderness over sinuses. Chest x-ray shows questionable bronchitic changes especially on the right.        Assessment & Plan:   1. Acute bronchitis  amoxicillin (AMOXIL) 875 MG tablet, fluconazole (DIFLUCAN) 150 MG tablet  2. Cough  CHEST X-RAY 2 VIEWS (71020)  3. Asthma in remission  montelukast (SINGULAIR) 10 MG tablet  4. Allergic rhinitis, cause unspecified    5. ROM (right otitis media)     I will also give Diflucan to help with vaginitis and occurs with using antibiotics. She is to call me if any problems. Recommend using albuterol more regularly.

## 2012-11-18 ENCOUNTER — Telehealth: Payer: Self-pay | Admitting: Internal Medicine

## 2012-11-18 DIAGNOSIS — F411 Generalized anxiety disorder: Secondary | ICD-10-CM

## 2012-11-18 DIAGNOSIS — Z309 Encounter for contraceptive management, unspecified: Secondary | ICD-10-CM

## 2012-11-18 DIAGNOSIS — J45998 Other asthma: Secondary | ICD-10-CM

## 2012-11-18 MED ORDER — BUDESONIDE-FORMOTEROL FUMARATE 160-4.5 MCG/ACT IN AERO: 2.0000 | INHALATION_SPRAY | Freq: Two times a day (BID) | RESPIRATORY_TRACT | Status: DC

## 2012-11-18 MED ORDER — SERTRALINE HCL 50 MG PO TABS: 50.0000 mg | ORAL_TABLET | Freq: Every day | ORAL | Status: DC

## 2012-11-18 MED ORDER — LEVONORG-ETH ESTRAD TRIPHASIC PO TABS: 1.0000 | ORAL_TABLET | Freq: Every day | ORAL | Status: DC

## 2012-11-18 NOTE — Telephone Encounter (Signed)
Med sent in.

## 2012-11-18 NOTE — Telephone Encounter (Signed)
Pt needs a refill with just a 30 day prescription on her enpresse, sertaline and symbicort to [facility]. Pt is out of med, and Pt is coming in for an appt on feburary 24 with Dr. Lynelle Doctor, and needs meds to get to her appt.

## 2012-11-18 NOTE — Telephone Encounter (Signed)
Okay 

## 2012-12-23 ENCOUNTER — Ambulatory Visit (INDEPENDENT_AMBULATORY_CARE_PROVIDER_SITE_OTHER): Payer: BC Managed Care – PPO | Admitting: Family Medicine

## 2012-12-23 ENCOUNTER — Other Ambulatory Visit (HOSPITAL_COMMUNITY)
Admission: RE | Admit: 2012-12-23 | Discharge: 2012-12-23 | Disposition: A | Discharge status: Home / Self Care | Payer: BC Managed Care – PPO | Source: Ambulatory Visit | Attending: Family Medicine | Admitting: Family Medicine

## 2012-12-23 ENCOUNTER — Encounter: Payer: Self-pay | Admitting: Family Medicine

## 2012-12-23 VITALS — BP 128/82 | HR 72 | Ht 66.0 in | Wt 189.0 lb

## 2012-12-23 DIAGNOSIS — Z01419 Encounter for gynecological examination (general) (routine) without abnormal findings: Secondary | ICD-10-CM | POA: Insufficient documentation

## 2012-12-23 DIAGNOSIS — Z1151 Encounter for screening for human papillomavirus (HPV): Secondary | ICD-10-CM | POA: Insufficient documentation

## 2012-12-23 DIAGNOSIS — J309 Allergic rhinitis, unspecified: Secondary | ICD-10-CM

## 2012-12-23 DIAGNOSIS — R002 Palpitations: Secondary | ICD-10-CM

## 2012-12-23 DIAGNOSIS — J45909 Unspecified asthma, uncomplicated: Secondary | ICD-10-CM

## 2012-12-23 DIAGNOSIS — J45998 Other asthma: Secondary | ICD-10-CM

## 2012-12-23 DIAGNOSIS — Z0000 Encounter for general adult medical examination without abnormal findings: Secondary | ICD-10-CM

## 2012-12-23 DIAGNOSIS — F411 Generalized anxiety disorder: Secondary | ICD-10-CM

## 2012-12-23 DIAGNOSIS — Z23 Encounter for immunization: Secondary | ICD-10-CM

## 2012-12-23 DIAGNOSIS — E781 Pure hyperglyceridemia: Secondary | ICD-10-CM

## 2012-12-23 DIAGNOSIS — R319 Hematuria, unspecified: Secondary | ICD-10-CM

## 2012-12-23 LAB — POCT URINALYSIS DIPSTICK
Bilirubin, UA: NEGATIVE
Glucose, UA: NEGATIVE
Ketones, UA: NEGATIVE
Spec Grav, UA: 1.01

## 2012-12-23 MED ORDER — MONTELUKAST SODIUM 10 MG PO TABS: 10.0000 mg | ORAL_TABLET | Freq: Every day | ORAL | Status: DC

## 2012-12-23 MED ORDER — SERTRALINE HCL 50 MG PO TABS: 50.0000 mg | ORAL_TABLET | Freq: Every day | ORAL | Status: DC

## 2012-12-23 MED ORDER — BUDESONIDE-FORMOTEROL FUMARATE 160-4.5 MCG/ACT IN AERO: 2.0000 | INHALATION_SPRAY | Freq: Two times a day (BID) | RESPIRATORY_TRACT | Status: DC

## 2012-12-23 MED ORDER — ALBUTEROL SULFATE HFA 108 (90 BASE) MCG/ACT IN AERS: 2.0000 | INHALATION_SPRAY | Freq: Four times a day (QID) | RESPIRATORY_TRACT | Status: DC | PRN

## 2012-12-23 NOTE — Patient Instructions (Addendum)
HEALTH MAINTENANCE RECOMMENDATIONS:  It is recommended that you get at least 30 minutes of aerobic exercise at least 5 days/week (for weight loss, you may need as much as 60-90 minutes). This can be any activity that gets your heart rate up. This can be divided in 10-15 minute intervals if needed, but try and build up your endurance at least once a week.  Weight bearing exercise is also recommended twice weekly.  Eat a healthy diet with lots of vegetables, fruits and fiber.  "Colorful" foods have a lot of vitamins (ie green vegetables, tomatoes, red peppers, etc).  Limit sweet tea, regular sodas and alcoholic beverages, all of which has a lot of calories and sugar.  Up to 1 alcoholic drink daily may be beneficial for women (unless trying to lose weight, watch sugars).  Drink a lot of water.  Calcium recommendations are 1200-1500 mg daily (1500 mg for postmenopausal women or women without ovaries), and vitamin D 1000 IU daily.  This should be obtained from diet and/or supplements (vitamins), and calcium should not be taken all at once, but in divided doses.  Monthly self breast exams and yearly mammograms for women over the age of 17 is recommended.  Sunscreen of at least SPF 30 should be used on all sun-exposed parts of the skin when outside between the hours of 10 am and 4 pm (not just when at beach or pool, but even with exercise, golf, tennis, and yard work!)  Use a sunscreen that says "broad spectrum" so it covers both UVA and UVB rays, and make sure to reapply every 1-2 hours.  Remember to change the batteries in your smoke detectors when changing your clock times in the spring and fall.  Use your seat belt every time you are in a car, and please drive safely and not be distracted with cell phones and texting while driving.   I recommend continuing your zoloft.  If you desire cutting back, I recommend cutting in 1/2 for 1-2 months, and if doing well, then taper off.  If increasing anxiety, go  back to full tablet daily.

## 2012-12-23 NOTE — Progress Notes (Signed)
Chief Complaint  Patient presents with  . Annual Exam    fasting annual exam with pap. Did not do eye exam as she just got new glasses. UA showed 2+ blood, pt is asymptomatic. Would like tlo discuss getting off bcp's and maybe having hormone levels checked. Would like to either lower zoloft 50mg  or perhaps something else, maybe more hollistic.    Hannah Werner is a 52 y.o. female who presents for a complete physical.  She has the following concerns:  Wants to stop birth control pills.  Wondering if she still needs them.  +hot flashes (mild) when on placebo pills.  Denies night sweats. Sometimes has a slight mucousy discharge a week after menses has ended, sometimes slight blood noted.  . Asthma--had bronchitis over Christmas.  Has fully recovered.  Hasn't needed to use rescue inhaler in 3-4 weeks.  Anxiety--wants to taper off zoloft and try essential oils.  Still occasionally needs alprazolam, especially if wakes up in the middle of the night with her heart racing. (last filled #30 06/19/12)   Immunization History  Administered Date(s) Administered  . DT 04/19/2000  . Influenza Split 08/14/2011  . Pneumococcal Polysaccharide 04/05/2011  . Tdap 04/05/2011  forgot to get flu shot this year Last Pap smear: 08/2010  Last mammogram: 08/2011 Last colonoscopy: poor prep 2010, never went back to have it redone (Dr. Loreta Ave).  Last DEXA: never  Dentist: twice early  Ophtho: yearly  Exercise: walking the dogs daily, at least 30 minutes, 2 nights/week at the gym (weights)  Past Medical History  Diagnosis Date  . Asthma   . Unspecified vitamin D deficiency 05/2009  . Pure hyperglyceridemia   . Anxiety   . Allergy     seasonal  . Family hx of colon cancer     mom    Past Surgical History  Procedure Laterality Date  . Colonoscopy  08/2009    inadequate prep.  Rec re-do with 3 day prep within 6 mos (Dr. Loreta Ave)  . Cholecystectomy  84    History   Social History  . Marital Status:  Married    Spouse Name: N/A    Number of Children: 0  . Years of Education: N/A   Occupational History  . Fish farm manager   Social History Main Topics  . Smoking status: Never Smoker   . Smokeless tobacco: Never Used  . Alcohol Use: Yes     Comment: 1 beer or mixed drink 3 times a week, less in 2014  . Drug Use: No  . Sexually Active: Yes -- Female partner(s)    Birth Control/ Protection: Pill   Other Topics Concern  . Not on file   Social History Narrative   Lives with her husband, 15 cats and 3 dogs    Family History  Problem Relation Age of Onset  . Diabetes Mother   . Cancer Mother 71    colon cancer  . Heart disease Father     CABG @69   . Hyperlipidemia Father   . Anxiety disorder Father   . Cancer Father     CLL  . Diabetes Sister   . Stroke Maternal Grandfather   . Asthma Neg Hx   . Diabetes Sister   . Seizures Sister     Current outpatient prescriptions:ALPRAZolam (XANAX) 0.5 MG tablet, Take 1 tablet (0.5 mg total) by mouth 3 (three) times daily as needed for anxiety., Disp: 30 tablet, Rfl: 0;  budesonide-formoterol (SYMBICORT) 160-4.5  MCG/ACT inhaler, Inhale 2 puffs into the lungs 2 (two) times daily., Disp: 3 Inhaler, Rfl: 3;  levonorgestrel-ethinyl estradiol (ENPRESSE,TRIVORA) tablet, Take 1 tablet by mouth daily., Disp: 3 Package, Rfl: 0 montelukast (SINGULAIR) 10 MG tablet, Take 1 tablet (10 mg total) by mouth at bedtime., Disp: 90 tablet, Rfl: 3;  Nutritional Supplements (JUICE PLUS FIBRE PO), Take by mouth as directed., Disp: , Rfl: ;  sertraline (ZOLOFT) 50 MG tablet, Take 1 tablet (50 mg total) by mouth daily., Disp: 90 tablet, Rfl: 3 albuterol (PROVENTIL HFA;VENTOLIN HFA) 108 (90 BASE) MCG/ACT inhaler, Inhale 2 puffs into the lungs every 6 (six) hours as needed for wheezing., Disp: 18 g, Rfl: 1  Allergies  Allergen Reactions  . Erythromycin Nausea And Vomiting    ROS: The patient denies anorexia, fever, headaches,  vision changes, decreased hearing, ear pain, sore throat, breast concerns, chest pain, dizziness, syncope, dyspnea on exertion, cough, swelling, nausea, vomiting, diarrhea, constipation, abdominal pain, melena, hematochezia, hematuria, incontinence, dysuria, irregular menstrual cycles, vaginal discharge, odor or itch, genital lesions, joint pains, numbness, tingling, weakness, tremor, suspicious skin lesions, depression, abnormal bleeding/bruising, or enlarged lymph nodes.  +mild hot flashes when on placebo birth control pills.  No further problems with GERD or palpitations. Some finger stiffness (4th and 5th fingers bilaterally), mild pain. +knee pain (mild, stable).  + anxiety, sleeps okay at night.   PHYSICAL EXAM: BP 128/82  Pulse 72  Ht 5\' 6"  (1.676 m)  Wt 189 lb (85.73 kg)  BMI 30.52 kg/m2  LMP 12/09/2012  General Appearance:  Alert, cooperative, no distress, appears stated age   Head:  Normocephalic, without obvious abnormality, atraumatic   Eyes:  PERRL, conjunctiva/corneas clear, EOM's intact, fundi  benign   Ears:  Normal TM's and external ear canals   Nose:  Nares normal, mucosa normal, no drainage or sinus tenderness   Throat:  Lips, mucosa, and tongue normal; teeth and gums normal   Neck:  Supple, no lymphadenopathy; thyroid: no enlargement/tenderness/nodules; no carotid  bruit or JVD   Back:  Spine nontender, no curvature, ROM normal, no CVA tenderness   Lungs:  Clear to auscultation bilaterally without wheezes, rales or ronchi; respirations unlabored   Chest Wall:  No tenderness or deformity   Heart:  Regular rate and rhythm, S1 and S2 normal, no murmur, rub  or gallop   Breast Exam:  No tenderness, masses, or nipple discharge or inversion. No axillary lymphadenopathy   Abdomen:  Soft, non-tender, nondistended, normoactive bowel sounds,  no masses, no hepatosplenomegaly   Genitalia:  Normal external genitalia without lesions. BUS and vagina normal; no cervical motion  tenderness. Cervix appears normal, no lesions. No abnormal vaginal discharge. Uterus and adnexa not enlarged, nontender, no masses. Pap performed   Rectal:  Normal tone, no masses or tenderness; guaiac negative stool; nonbleeding external hemorrhoid   Extremities:  No clubbing, cyanosis or edema   Pulses:  2+ and symmetric all extremities   Skin:  Skin color, texture, turgor normal, no rashes or lesions   Lymph nodes:  Cervical, supraclavicular, and axillary nodes normal   Neurologic:  CNII-XII intact, normal strength, sensation and gait; reflexes 2+ and symmetric throughout          Psych: Normal mood, affect, hygiene and grooming.   ASSESSMENT/PLAN:  Routine general medical examination at a health care facility - Plan: POCT Urinalysis Dipstick, Glucose, random, TSH, Cytology - PAP  Pure hyperglyceridemia - Plan: Lipid panel  Anxiety state, unspecified - Plan: sertraline (ZOLOFT) 50 MG  tablet  Asthma in remission - Plan: montelukast (SINGULAIR) 10 MG tablet, budesonide-formoterol (SYMBICORT) 160-4.5 MCG/ACT inhaler, albuterol (PROVENTIL HFA;VENTOLIN HFA) 108 (90 BASE) MCG/ACT inhaler  Allergic rhinitis, cause unspecified  Palpitations - Plan: TSH  Hematuria - Plan: Urine culture  Need for prophylactic vaccination and inoculation against influenza - Plan: Flu vaccine greater than or equal to 3yo preservative free IM  Discussed monthly self breast exams and yearly mammograms after the age of 72; at least 30 minutes of aerobic activity at least 5 days/week; proper sunscreen use reviewed; healthy diet, including goals of calcium and vitamin D intake and alcohol recommendations (less than or equal to 1 drink/day) reviewed; regular seatbelt use; changing batteries in smoke detectors. Immunization recommendations discussed. Colonoscopy recommendations reviewed--past due, will call to schedule   Flu shot today.  To get in the Fall yearly   Anxiety--encouraged her to continue meds.  Discussed  how to taper quickly, vs recommending that she decrease to 1/2 tablet for 1-2 months, and taper off completely only if no increased anxiety at lower dose (only if she truly desires to come off).  I recommended that she continue her meds.  Ok to stop OCP's. Use contraception.  Discussed stopping pills, and if no periods, she would want confirmation with FSH. Discussed FSH after placebo week, but she is okay with just stopping pills entirely, waiting to see what happens to her periods.   Counseled re: perimenopausal symptoms, vs postmenopausal.  All questions answered.

## 2012-12-24 ENCOUNTER — Encounter: Payer: Self-pay | Admitting: Family Medicine

## 2012-12-24 LAB — TSH: TSH: 1.294 u[IU]/mL (ref 0.350–4.500)

## 2012-12-24 LAB — LIPID PANEL
LDL Cholesterol: 80 mg/dL (ref 0–99)
Total CHOL/HDL Ratio: 2.4 Ratio
VLDL: 26 mg/dL (ref 0–40)

## 2012-12-24 LAB — GLUCOSE, RANDOM: Glucose, Bld: 88 mg/dL (ref 70–99)

## 2012-12-26 LAB — URINE CULTURE

## 2013-02-03 ENCOUNTER — Telehealth: Payer: Self-pay | Admitting: Family Medicine

## 2013-02-03 DIAGNOSIS — F411 Generalized anxiety disorder: Secondary | ICD-10-CM

## 2013-02-03 MED ORDER — ALPRAZOLAM 0.5 MG PO TABS: 0.5000 mg | ORAL_TABLET | Freq: Three times a day (TID) | ORAL | Status: DC | PRN

## 2013-02-03 NOTE — Telephone Encounter (Signed)
Last filled 05/2012.  Okay for refill of alprazolam

## 2013-02-03 NOTE — Telephone Encounter (Signed)
Pt called and wants refill for generic Xanax to [facility].  Also pt is going to [place] on May 5th and wants to know does she need to see you first pt phon [phone]

## 2013-02-03 NOTE — Telephone Encounter (Signed)
Done

## 2013-02-10 ENCOUNTER — Telehealth: Payer: Self-pay | Admitting: Internal Medicine

## 2013-02-10 DIAGNOSIS — F411 Generalized anxiety disorder: Secondary | ICD-10-CM

## 2013-02-10 MED ORDER — SERTRALINE HCL 50 MG PO TABS: 50.0000 mg | ORAL_TABLET | Freq: Every day | ORAL | Status: DC

## 2013-02-10 NOTE — Telephone Encounter (Signed)
Spoke with patient and she will be away when it is time to refill her mail order 90 day refill. 30 days worth to [facility] will be enough to get her through. Called in to [facility] sertraline 50mg  #30.

## 2013-02-10 NOTE — Telephone Encounter (Signed)
Pt would like a month supply of sertraline 50mg  sent [facility] cause she will be out of town on may 2 when its time to fill it and wont be able to get it

## 2013-02-13 ENCOUNTER — Ambulatory Visit (INDEPENDENT_AMBULATORY_CARE_PROVIDER_SITE_OTHER): Payer: BC Managed Care – PPO | Admitting: Family Medicine

## 2013-02-13 ENCOUNTER — Encounter: Payer: Self-pay | Admitting: Family Medicine

## 2013-02-13 VITALS — BP 148/84 | HR 72 | Ht 66.0 in | Wt 188.0 lb

## 2013-02-13 DIAGNOSIS — R238 Other skin changes: Secondary | ICD-10-CM

## 2013-02-13 DIAGNOSIS — L988 Other specified disorders of the skin and subcutaneous tissue: Secondary | ICD-10-CM

## 2013-02-13 MED ORDER — SULFAMETHOXAZOLE-TRIMETHOPRIM 800-160 MG PO TABS: 1.0000 | ORAL_TABLET | Freq: Two times a day (BID) | ORAL | Status: DC

## 2013-02-13 NOTE — Patient Instructions (Signed)
I have prescribed Septra DS for you to take to [place] with you in case you get sick.  This would cover sinus infection, bronchitis and urinary infection.  Use full 10 days for sinus and bronchitis, only 5-7 days for urinary tract.    Expect area that was frozen today to blister and scab.  Keep it clean and use antibacterial ointment until healed . If there is a persistent lesion after scab falls off, return for re-evaluation.

## 2013-02-13 NOTE — Progress Notes (Signed)
Chief Complaint  Patient presents with  . Procedure    has a mole on her chest that popped up about 6 weeks ago. It is irritating her, painful. Would like to have removed today if possible. Would like to know if she could possinly have an rx to take to [place] with her May 5th.   She first noticed a bump on the innher portion of her upper left breast about 6 weeks ago.  Doesn't recall having any mole or skin lesion there prior.  It has been very itchy, and inflamed.  She has been scratching at it, and subsequently used a bandage which led to increased area of redness and itchiness between her breasts.  She has used some OTC cortisone cream which helps a little with the itching.  The lump, however, has not gone away.  Denies any pain, drainage, just feels irritated and itchy.  She is going to [place], and is asking for an antibiotic prescription to take just in case she gets sick.  Past Medical History  Diagnosis Date  . Asthma   . Unspecified vitamin D deficiency 05/2009  . Pure hyperglyceridemia   . Anxiety   . Allergy     seasonal  . Family hx of colon cancer     mom   History   Social History  . Marital Status: Married    Spouse Name: N/A    Number of Children: 0  . Years of Education: N/A   Occupational History  . Fish farm manager   Social History Main Topics  . Smoking status: Never Smoker   . Smokeless tobacco: Never Used  . Alcohol Use: Yes     Comment: 1 beer or mixed drink 3 times a week, less in 2014  . Drug Use: No  . Sexually Active: Yes -- Female partner(s)    Birth Control/ Protection: Pill   Other Topics Concern  . Not on file   Social History Narrative   Lives with her husband, 15 cats and 3 dogs   Current Outpatient Prescriptions on File Prior to Visit  Medication Sig Dispense Refill  . budesonide-formoterol (SYMBICORT) 160-4.5 MCG/ACT inhaler Inhale 2 puffs into the lungs 2 (two) times daily.  3 Inhaler  3  . montelukast  (SINGULAIR) 10 MG tablet Take 1 tablet (10 mg total) by mouth at bedtime.  90 tablet  3  . Nutritional Supplements (JUICE PLUS FIBRE PO) Take by mouth as directed.      . sertraline (ZOLOFT) 50 MG tablet Take 1 tablet (50 mg total) by mouth daily.  30 tablet  0  . albuterol (PROVENTIL HFA;VENTOLIN HFA) 108 (90 BASE) MCG/ACT inhaler Inhale 2 puffs into the lungs every 6 (six) hours as needed for wheezing.  18 g  1  . ALPRAZolam (XANAX) 0.5 MG tablet Take 1 tablet (0.5 mg total) by mouth 3 (three) times daily as needed for anxiety.  30 tablet  0   No current facility-administered medications on file prior to visit.   Allergies  Allergen Reactions  . Erythromycin Nausea And Vomiting   ROS:  Denies fevers, URI symptoms ,cough, shortness of breath, other skin rashes except per HPI. Denies nausea, vomiting, diarrhea or urinary complaints.  PHYSICAL EXAM: BP 148/84  Pulse 72  Ht 5\' 6"  (1.676 m)  Wt 188 lb (85.276 kg)  BMI 30.36 kg/m2  LMP 01/08/2013 Well developed, pleasant female in no distress.  Skin: between breasts, on left side there is in  inflamed papule.  It is slightly pink, with excoriated/scabbed top.  There is a linear area of surrounding redness with some fine raised papules, in area consistent with a bandaid.  There is no drainage, fluctuance, induration or significant warmth or erythema surrounding the papule itself   ASSESSMENT/PLAN: Skin papules, generalized - Plan: PR DESTRUC BENIGN/PREMAL,FIRST LESION  Irritated papule L chest.  Given that she wasn't aware of this area until recently, it is possible she had a small skin tag or other benign lesion that has just become inflamed, versus a new lesion, which wouldn't really suggest a cancer.  We discussed options including steroid cream (given that there is an inflammatory and itchy component, partly related to contact dermatitis from bandage) vs freezing the area, and possibly biopsy if it doesn't resolve.  Elects the latter  treatment.  Discussed proper wound care  F/u for re-eval for persistent lesion.  Sent Rx for Septra DS to pharmacy to take with her on trip--to use prn for sinusitis, bronchitis or UTI.  How to take, risks and side effects reviewed.  She has previously taken without allergic reaction.

## 2013-04-28 ENCOUNTER — Encounter: Payer: Self-pay | Admitting: Family Medicine

## 2013-04-28 ENCOUNTER — Ambulatory Visit (INDEPENDENT_AMBULATORY_CARE_PROVIDER_SITE_OTHER): Payer: BC Managed Care – PPO | Admitting: Family Medicine

## 2013-04-28 VITALS — BP 130/86 | HR 80 | Temp 97.9°F | Ht 66.0 in | Wt 188.0 lb

## 2013-04-28 DIAGNOSIS — R109 Unspecified abdominal pain: Secondary | ICD-10-CM

## 2013-04-28 LAB — POCT URINALYSIS DIPSTICK
Bilirubin, UA: NEGATIVE
Glucose, UA: NEGATIVE
Nitrite, UA: NEGATIVE

## 2013-04-28 NOTE — Patient Instructions (Signed)
Take 1-2 aleve twice daily with food for 7-10 days (for up to 2 weeks).  Return in 1-2 weeks if pain persists/worsens.  Try warm compresses and gentle stretches of abdominal muscles.  We are sending your urine off to rule out infection.

## 2013-04-28 NOTE — Progress Notes (Signed)
Chief Complaint  Patient presents with  . Abdominal Pain    x 10 days. Extreme cramping, hot flashes and joint pains that come and go mainly in her elbows.   Gradual onset of discomfort in R lower abdomen started a few weeks ago, progressively getting worse.  Described as "uncomfortable", like "there is something there".  Squeezing type of pain, not sharp, not burning.  Pain is constant today, hurts more with sitting, feels better standing.  Not related to her bowels or eating, bowels have been normal.  Prior to today, pain was off and on.  Feels similar to UTI, except for the lack of dysuria (but squeezing over bladder feels similar).  Denies vaginal discharge, has some dryness.  Hot flashes have been more intense over the last 2 weeks.  She also had some back pain in the last couple of weeks, but that has improved.  Past Medical History  Diagnosis Date  . Asthma   . Unspecified vitamin D deficiency 05/2009  . Pure hyperglyceridemia   . Anxiety   . Allergy     seasonal  . Family hx of colon cancer     mom   Past Surgical History  Procedure Laterality Date  . Colonoscopy  08/2009    inadequate prep.  Rec re-do with 3 day prep within 6 mos (Dr. Loreta Ave)  . Cholecystectomy  84   History   Social History  . Marital Status: Married    Spouse Name: N/A    Number of Children: 0  . Years of Education: N/A   Occupational History  . Fish farm manager   Social History Main Topics  . Smoking status: Never Smoker   . Smokeless tobacco: Never Used  . Alcohol Use: Yes     Comment: 1 beer or mixed drink 3 times a week, less in 2014  . Drug Use: No  . Sexually Active: Yes -- Female partner(s)    Birth Control/ Protection: Pill   Other Topics Concern  . Not on file   Social History Narrative   Lives with her husband, 15 cats and 3 dogs   Current Outpatient Prescriptions on File Prior to Visit  Medication Sig Dispense Refill  . ALPRAZolam (XANAX) 0.5 MG  tablet Take 1 tablet (0.5 mg total) by mouth 3 (three) times daily as needed for anxiety.  30 tablet  0  . budesonide-formoterol (SYMBICORT) 160-4.5 MCG/ACT inhaler Inhale 2 puffs into the lungs 2 (two) times daily.  3 Inhaler  3  . montelukast (SINGULAIR) 10 MG tablet Take 1 tablet (10 mg total) by mouth at bedtime.  90 tablet  3  . Nutritional Supplements (JUICE PLUS FIBRE PO) Take by mouth as directed.      . sertraline (ZOLOFT) 50 MG tablet Take 1 tablet (50 mg total) by mouth daily.  30 tablet  0  . albuterol (PROVENTIL HFA;VENTOLIN HFA) 108 (90 BASE) MCG/ACT inhaler Inhale 2 puffs into the lungs every 6 (six) hours as needed for wheezing.  18 g  1   No current facility-administered medications on file prior to visit.   Allergies  Allergen Reactions  . Erythromycin Nausea And Vomiting   ROS: Denies fevers, chills, nausea, vomiting.  Denies any hematuria, odor to urine or other urinary symptoms.  No vaginal discharge.  No headaches, dizziness, chest pain, edema, bleeding/bruising.  Complaining of pain in her left elbow (laterally).  Denies change in activity.  Going to [employer], but same exercise routine  x 2 months.  PHYSICAL EXAM: BP 130/86  Pulse 80  Temp(Src) 97.9 F (36.6 C) (Oral)  Ht 5\' 6"  (1.676 m)  Wt 188 lb (85.276 kg)  BMI 30.36 kg/m2  LMP 12/03/2012 Pleasant female, slightly anxious but in no distress Neck: no lymphadenopathy or mass Heart: regular rate and rhythm.  Lungs: clear bilaterally Abdomen: Tender just to the right of midline above pubic bone.  Pain in this area with muscle contraction.  No bulge or hernia noted Pelvic exam--tender anteriorly over bladder, and over R adnexa, which is not enlarged. Back: no spine or CVA tenderness. No muscle spasm  Urine dip:  1+ blood, trace leuks  ASSESSMENT/PLAN: Abdominal pain, unspecified site - Plan: POCT Urinalysis Dipstick, Urine culture  R sided pelvic/abdominal pain.  Likely related to muscle strain.  Can't  r/o small ovarian cyst.  R/o UTI given tenderness over bladder on exam and abnormal urine dip.  Treat with Aleve and warm compresses, stretches and rest.  F/u in 1-2 weeks if symptoms persist, worsen

## 2013-04-29 ENCOUNTER — Encounter: Payer: Self-pay | Admitting: Family Medicine

## 2013-04-29 LAB — URINE CULTURE

## 2013-08-08 ENCOUNTER — Other Ambulatory Visit: Payer: Self-pay | Admitting: Family Medicine

## 2013-08-08 NOTE — Telephone Encounter (Signed)
Is this okay to refill? 

## 2013-08-08 NOTE — Telephone Encounter (Signed)
Ok to refill #30, no add'l refills.  She needs to schedule her CPE (due end of February)

## 2013-08-08 NOTE — Telephone Encounter (Signed)
Medication phoned and left message asking patient to please call and scheduled CPE for sometime after 12/23/13.

## 2013-09-15 ENCOUNTER — Encounter: Payer: Self-pay | Admitting: Family Medicine

## 2013-09-15 ENCOUNTER — Ambulatory Visit
Admission: RE | Admit: 2013-09-15 | Discharge: 2013-09-15 | Disposition: A | Discharge status: Home / Self Care | Payer: BC Managed Care – PPO | Source: Ambulatory Visit | Attending: Family Medicine | Admitting: Family Medicine

## 2013-09-15 ENCOUNTER — Ambulatory Visit (INDEPENDENT_AMBULATORY_CARE_PROVIDER_SITE_OTHER): Payer: BC Managed Care – PPO | Admitting: Family Medicine

## 2013-09-15 VITALS — BP 122/78 | HR 88 | Ht 68.0 in | Wt 187.0 lb

## 2013-09-15 DIAGNOSIS — M25519 Pain in unspecified shoulder: Secondary | ICD-10-CM

## 2013-09-15 DIAGNOSIS — M25511 Pain in right shoulder: Secondary | ICD-10-CM

## 2013-09-15 MED ORDER — MELOXICAM 15 MG PO TABS: 15.0000 mg | ORAL_TABLET | Freq: Every day | ORAL | Status: DC

## 2013-09-15 NOTE — Patient Instructions (Signed)
Go to [facility] for x-ray of your shoulder. Start taking the anti-inflammatory once daily (probably best to take with dinner, since pain is worse at night).  You might need to alter your sleep position (sleep on left side) to minimize pain. Avoid other pain pills--tylenol/acetaminophen is okay. Do range of motion exercises as shown.  Consider seeing chiropractor (with ART) vs possibly PT.  Return in 2 weeks for re-eval and referral if not improving.
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
## 2013-10-17 ENCOUNTER — Emergency Department (HOSPITAL_COMMUNITY)
Admission: EM | Admit: 2013-10-17 | Discharge: 2013-10-17 | Disposition: A | Discharge status: Home / Self Care | Payer: BC Managed Care – PPO | Attending: Emergency Medicine | Admitting: Emergency Medicine

## 2013-10-17 ENCOUNTER — Encounter (HOSPITAL_COMMUNITY): Payer: Self-pay | Admitting: Emergency Medicine

## 2013-10-17 ENCOUNTER — Emergency Department (HOSPITAL_COMMUNITY): Payer: BC Managed Care – PPO

## 2013-10-17 DIAGNOSIS — F411 Generalized anxiety disorder: Secondary | ICD-10-CM | POA: Insufficient documentation

## 2013-10-17 DIAGNOSIS — Y9389 Activity, other specified: Secondary | ICD-10-CM | POA: Insufficient documentation

## 2013-10-17 DIAGNOSIS — Z862 Personal history of diseases of the blood and blood-forming organs and certain disorders involving the immune mechanism: Secondary | ICD-10-CM | POA: Insufficient documentation

## 2013-10-17 DIAGNOSIS — Z79899 Other long term (current) drug therapy: Secondary | ICD-10-CM | POA: Insufficient documentation

## 2013-10-17 DIAGNOSIS — J45909 Unspecified asthma, uncomplicated: Secondary | ICD-10-CM | POA: Insufficient documentation

## 2013-10-17 DIAGNOSIS — Z8639 Personal history of other endocrine, nutritional and metabolic disease: Secondary | ICD-10-CM | POA: Insufficient documentation

## 2013-10-17 DIAGNOSIS — S0993XA Unspecified injury of face, initial encounter: Secondary | ICD-10-CM | POA: Insufficient documentation

## 2013-10-17 DIAGNOSIS — Y92410 Unspecified street and highway as the place of occurrence of the external cause: Secondary | ICD-10-CM | POA: Insufficient documentation

## 2013-10-17 MED ORDER — OXYCODONE-ACETAMINOPHEN 5-325 MG PO TABS
1.0000 | ORAL_TABLET | Freq: Once | ORAL | Status: AC
  Administered 2013-10-17: 1 via ORAL
  Filled 2013-10-17: qty 1

## 2013-10-17 MED ORDER — METHOCARBAMOL 500 MG PO TABS: 500.0000 mg | ORAL_TABLET | Freq: Two times a day (BID) | ORAL | Status: DC

## 2013-10-17 MED ORDER — IBUPROFEN 800 MG PO TABS: 800.0000 mg | ORAL_TABLET | Freq: Three times a day (TID) | ORAL | Status: DC

## 2013-10-17 NOTE — ED Notes (Signed)
Pt returned from xray
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
## 2013-10-17 NOTE — ED Notes (Signed)
Patient transported to X-ray 
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
## 2013-10-18 NOTE — ED Provider Notes (Signed)
Medical screening examination/treatment/procedure(s) were performed by non-physician practitioner and as supervising physician I was immediately available for consultation/collaboration.  EKG Interpretation   None        Braelynne Garinger R. Alexzander Dolinger, MD 10/18/13 0714 

## 2013-11-06 ENCOUNTER — Other Ambulatory Visit (INDEPENDENT_AMBULATORY_CARE_PROVIDER_SITE_OTHER): Payer: BC Managed Care – PPO

## 2013-11-06 DIAGNOSIS — Z23 Encounter for immunization: Secondary | ICD-10-CM

## 2013-11-06 DIAGNOSIS — Z2911 Encounter for prophylactic immunotherapy for respiratory syncytial virus (RSV): Secondary | ICD-10-CM

## 2013-11-10 ENCOUNTER — Other Ambulatory Visit: Payer: Self-pay

## 2013-11-11 LAB — HM MAMMOGRAPHY

## 2013-11-12 ENCOUNTER — Encounter: Payer: Self-pay | Admitting: Internal Medicine

## 2013-11-17 ENCOUNTER — Other Ambulatory Visit: Payer: Self-pay | Admitting: Family Medicine

## 2014-01-01 ENCOUNTER — Ambulatory Visit (INDEPENDENT_AMBULATORY_CARE_PROVIDER_SITE_OTHER): Payer: BC Managed Care – PPO | Admitting: Family Medicine

## 2014-01-01 ENCOUNTER — Encounter: Payer: Self-pay | Admitting: Family Medicine

## 2014-01-01 VITALS — BP 112/76 | HR 88 | Temp 98.0°F | Ht 65.0 in | Wt 185.0 lb

## 2014-01-01 DIAGNOSIS — F411 Generalized anxiety disorder: Secondary | ICD-10-CM

## 2014-01-01 DIAGNOSIS — Z0000 Encounter for general adult medical examination without abnormal findings: Secondary | ICD-10-CM

## 2014-01-01 DIAGNOSIS — J069 Acute upper respiratory infection, unspecified: Secondary | ICD-10-CM

## 2014-01-01 DIAGNOSIS — J309 Allergic rhinitis, unspecified: Secondary | ICD-10-CM

## 2014-01-01 DIAGNOSIS — B353 Tinea pedis: Secondary | ICD-10-CM

## 2014-01-01 DIAGNOSIS — J45909 Unspecified asthma, uncomplicated: Secondary | ICD-10-CM

## 2014-01-01 DIAGNOSIS — Z800 Family history of malignant neoplasm of digestive organs: Secondary | ICD-10-CM

## 2014-01-01 DIAGNOSIS — J45998 Other asthma: Secondary | ICD-10-CM

## 2014-01-01 DIAGNOSIS — Z79899 Other long term (current) drug therapy: Secondary | ICD-10-CM

## 2014-01-01 DIAGNOSIS — E559 Vitamin D deficiency, unspecified: Secondary | ICD-10-CM

## 2014-01-01 LAB — POCT URINALYSIS DIPSTICK
BILIRUBIN UA: NEGATIVE
Glucose, UA: NEGATIVE
Ketones, UA: NEGATIVE
NITRITE UA: NEGATIVE
PH UA: 7
Protein, UA: NEGATIVE
Spec Grav, UA: 1.015
Urobilinogen, UA: NEGATIVE

## 2014-01-01 LAB — COMPREHENSIVE METABOLIC PANEL
ALT: 20 U/L (ref 0–35)
AST: 17 U/L (ref 0–37)
Albumin: 4.8 g/dL (ref 3.5–5.2)
Alkaline Phosphatase: 103 U/L (ref 39–117)
BUN: 12 mg/dL (ref 6–23)
CALCIUM: 9.7 mg/dL (ref 8.4–10.5)
CHLORIDE: 102 meq/L (ref 96–112)
CO2: 27 meq/L (ref 19–32)
Creat: 0.64 mg/dL (ref 0.50–1.10)
Glucose, Bld: 86 mg/dL (ref 70–99)
POTASSIUM: 4.3 meq/L (ref 3.5–5.3)
SODIUM: 139 meq/L (ref 135–145)
TOTAL PROTEIN: 7.3 g/dL (ref 6.0–8.3)
Total Bilirubin: 0.6 mg/dL (ref 0.2–1.2)

## 2014-01-01 MED ORDER — ALPRAZOLAM 0.5 MG PO TABS: 0.2500 mg | ORAL_TABLET | Freq: Three times a day (TID) | ORAL | Status: DC | PRN

## 2014-01-01 MED ORDER — MONTELUKAST SODIUM 10 MG PO TABS: 10.0000 mg | ORAL_TABLET | Freq: Every day | ORAL | Status: DC

## 2014-01-01 MED ORDER — BUDESONIDE-FORMOTEROL FUMARATE 160-4.5 MCG/ACT IN AERO: 2.0000 | INHALATION_SPRAY | Freq: Two times a day (BID) | RESPIRATORY_TRACT | Status: DC

## 2014-01-01 MED ORDER — ALBUTEROL SULFATE HFA 108 (90 BASE) MCG/ACT IN AERS: 2.0000 | INHALATION_SPRAY | Freq: Four times a day (QID) | RESPIRATORY_TRACT | Status: DC | PRN

## 2014-01-01 MED ORDER — SERTRALINE HCL 50 MG PO TABS: ORAL_TABLET | ORAL | Status: DC

## 2014-01-01 NOTE — Progress Notes (Signed)
Chief Complaint  Patient presents with  . Annual Exam    fasting annual exam with pelvic had pap last year with Dr.Tephanie Escorcia. Would like to discuss her breathing issues with doctor today. Hasn't really been sick, but having some breathing trouble (asthmatic).    Hannah Werner is a 53 y.o. female who presents for a complete physical.  She has the following concerns:  She has had a mild cold, which has settled into her lungs.  She was sick a month ago, seemed to get better, but last week had recurrent congestion in her lungs and shortness of breath. She is having sinus congestion, but no runny nose or sneezing.  She is having discomfort and pressure in her right cheek.  Mucus is green and bloody.  Symptoms started 1 week ago, came and went for a couple of days, worse in the last couple of days.  Feels tight and wheezy in her chest.  She is needed to use albuterol yesterday morning, didn't need it later in the day.  Hasn't used it today, and feels okay, just slightly short of breath.  She has been compliant with daily use of her Symbicort.  She took Nyquil last week, and Vick's in the humidifier (which helps).  Hasn't used any other OTC medications.  Dark circular area noted on the top of her right foot about a year ago.  Has gotten slightly larger, gradually.  Never itched.  H/o ringworm in the past, not in this area.  Right shoulder pain is much better (see last visit, 08/2013).  She went to [facility], and is now down to once monthly visits.  Anxiety:  She decided NOT to try and taper down of the sertraline (especially because having hot flashes, which seems to trigger some anxiety).  Denies side effects.  Anxiety (other than related to having symptoms of panic just prior to a hot flash) is well controlled.  Only uses xanax with travel/flying, rarely needs it otherwise.     Immunization History  Administered Date(s) Administered  . DT 04/19/2000  . Influenza Split 08/14/2011  . Influenza, Seasonal,  Injecte, Preservative Fre 12/23/2012  . Influenza-Unspecified 08/01/2013  . Pneumococcal Polysaccharide-23 04/05/2011  . Tdap 04/05/2011  . Zoster 11/06/2013   Last Pap smear:  11/2012 Last mammogram:  10/2013 Last colonoscopy: poor prep 2010, never went back to have it redone (Dr. Collene Mares).  Last DEXA: never  Dentist: twice early  Ophtho: yearly  Exercise: walking the dogs daily, at least 30 minutes, 2-3 nights/week at the gym (weights)   Past Medical History  Diagnosis Date  . Asthma   . Unspecified vitamin D deficiency 05/2009  . Pure hyperglyceridemia   . Anxiety   . Allergy     seasonal  . Family hx of colon cancer     mom    Past Surgical History  Procedure Laterality Date  . Colonoscopy  08/2009    inadequate prep.  Rec re-do with 3 day prep within 6 mos (Dr. Collene Mares)  . Cholecystectomy  84    History   Social History  . Marital Status: Married    Spouse Name: N/A    Number of Children: 0  . Years of Education: N/A   Occupational History  . [employer]---name changed after merger) [place] Topics  . Smoking status: Never Smoker   . Smokeless tobacco: Never Used  . Alcohol Use:  0.6 oz/week    1 Cans of beer per week  . Drug Use: No  . Sexual Activity: Yes    Partners: Male    Birth Control/ Protection: None   Other Topics Concern  . Not on file   Social History Narrative   Lives with her husband, 15 cats and 3 dogs. Husband smokes outside (recently restarted 2014)    Family History  Problem Relation Age of Onset  . Diabetes Mother   . Cancer Mother 44    colon cancer  . Heart disease Father     CABG @69   . Hyperlipidemia Father   . Anxiety disorder Father   . Cancer Father     CLL  . Diabetes Sister   . Stroke Maternal Grandfather   . Asthma Neg Hx   . Diabetes Sister   . Seizures Sister    Outpatient Encounter Prescriptions as of 01/01/2014   Medication Sig Note  . albuterol (PROVENTIL HFA;VENTOLIN HFA) 108 (90 BASE) MCG/ACT inhaler Inhale 2 puffs into the lungs every 6 (six) hours as needed for wheezing.   Marland Kitchen ALPRAZolam (XANAX) 0.5 MG tablet Take 0.5-1 tablets (0.25-0.5 mg total) by mouth 3 (three) times daily as needed for anxiety.   . Ascorbic Acid (VITAMIN C) 1000 MG tablet Take 1,000 mg by mouth daily.   . budesonide-formoterol (SYMBICORT) 160-4.5 MCG/ACT inhaler Inhale 2 puffs into the lungs 2 (two) times daily.   . montelukast (SINGULAIR) 10 MG tablet Take 1 tablet (10 mg total) by mouth at bedtime.   . Probiotic Product (PROBIOTIC DAILY PO) Take 1 tablet by mouth daily.    . sertraline (ZOLOFT) 50 MG tablet TAKE 1 BY MOUTH DAILY   . [DISCONTINUED] albuterol (PROVENTIL HFA;VENTOLIN HFA) 108 (90 BASE) MCG/ACT inhaler Inhale 2 puffs into the lungs every 6 (six) hours as needed for wheezing. 01/01/2014: Needed it yesterday once, otherwise only with illnesses  . [DISCONTINUED] ALPRAZolam (XANAX) 0.5 MG tablet Take 0.5 mg by mouth 3 (three) times daily as needed for anxiety. 01/01/2014: Uses it for flying/travel  . [DISCONTINUED] budesonide-formoterol (SYMBICORT) 160-4.5 MCG/ACT inhaler Inhale 2 puffs into the lungs 2 (two) times daily. 01/01/2014: Using 2 puffs BID (cuts back dose to 1 Puff BID in summer)  . [DISCONTINUED] montelukast (SINGULAIR) 10 MG tablet Take 10 mg by mouth at bedtime.   . [DISCONTINUED] sertraline (ZOLOFT) 50 MG tablet TAKE 1 BY MOUTH DAILY   . ibuprofen (ADVIL,MOTRIN) 800 MG tablet Take 1 tablet (800 mg total) by mouth 3 (three) times daily.   . Nutritional Supplements (JUICE PLUS FIBRE PO) Take 2 tablets by mouth daily.  01/01/2014: She ran out, hasn't been taking  . [DISCONTINUED] methocarbamol (ROBAXIN) 500 MG tablet Take 1 tablet (500 mg total) by mouth 2 (two) times daily.     Allergies  Allergen Reactions  . Erythromycin Nausea And Vomiting   ROS: The patient denies anorexia, fever, headaches, vision  changes, decreased hearing, ear pain, sore throat, breast concerns, chest pain, dizziness, syncope, dyspnea on exertion, swelling, nausea, vomiting, diarrhea, constipation, abdominal pain, melena, hematochezia, hematuria, incontinence, dysuria, vaginal bleeding, discharge, odor or itch, genital lesions, joint pains, numbness, tingling, weakness, tremor, suspicious skin lesions, depression, abnormal bleeding/bruising, or enlarged lymph nodes.  Some finger stiffness (4th and 5th fingers bilaterally), mild pain.  +knee pain (mild, stable).  Some mild constipation recently. No period in a year (since stopping OCP's at physical last year) Hot flashes about 1/hr, no night sweats.  PHYSICAL EXAM:  BP 112/76  Pulse 88  Temp(Src) 98 F (36.7 C) (Oral)  Ht 5\' 5"  (1.651 m)  Wt 185 lb (83.915 kg)  BMI 30.79 kg/m2  General Appearance:  Alert, cooperative, no distress, appears stated age   Head:  Normocephalic, without obvious abnormality, atraumatic   Eyes:  PERRL, conjunctiva/corneas clear, EOM's intact, fundi  benign   Ears:  Normal TM's and external ear canals   Nose:  Nares normal; nasal mucosa is mild-mod edematous with clear mucus and recent bleeding on right; sinuses nontender  Throat:  Lips, mucosa, and tongue normal; teeth and gums normal. R TMJ clicked/popped with opening mouth.  Neck:  Supple, no lymphadenopathy; thyroid: no enlargement/tenderness/nodules; no carotid  bruit or JVD   Back:  Spine nontender, no curvature, ROM normal, no CVA tenderness   Lungs:  Clear to auscultation bilaterally without wheezes, rales or ronchi; respirations unlabored   Chest Wall:  No tenderness or deformity   Heart:  Regular rate and rhythm, S1 and S2 normal, no murmur, rub  or gallop   Breast Exam:  No tenderness, masses, or nipple discharge or inversion. No axillary lymphadenopathy   Abdomen:  Soft, non-tender, nondistended, normoactive bowel sounds,  no masses, no hepatosplenomegaly   Genitalia:   Normal external genitalia without lesions. BUS and vagina normal; no cervical motion tenderness. No abnormal vaginal discharge. Uterus and adnexa not enlarged, nontender, no masses. Pap not performed   Rectal:  Normal tone, no masses or tenderness; guaiac negative stool; nonbleeding external hemorrhoid   Extremities:  No clubbing, cyanosis or edema   Pulses:  2+ and symmetric all extremities   Skin:  Skin color, texture, turgor normal. Hyperpigmented circular macular area on top of the right foot, with slight scaling at the edges, but no central clearing (pigmentation is uniform, mild)   Lymph nodes:  Cervical, supraclavicular, and axillary nodes normal   Neurologic:  CNII-XII intact, normal strength, sensation and gait; reflexes 2+ and symmetric throughout          Psych: Normal mood, affect, hygiene and grooming.   ASSESSMENT/PLAN:  Routine general medical examination at a health care facility - Plan: Visual acuity screening, POCT Urinalysis Dipstick, Comprehensive metabolic panel, Vit D  25 hydroxy (rtn osteoporosis monitoring), TSH  Asthma in remission - mild exacerbation with current URI - Plan: budesonide-formoterol (SYMBICORT) 160-4.5 MCG/ACT inhaler, montelukast (SINGULAIR) 10 MG tablet, albuterol (PROVENTIL HFA;VENTOLIN HFA) 108 (90 BASE) MCG/ACT inhaler  Anxiety state, unspecified - stable, controlled - Plan: sertraline (ZOLOFT) 50 MG tablet, ALPRAZolam (XANAX) 0.5 MG tablet  Allergic rhinitis, cause unspecified  Family history of colon cancer - past due for f/u with Dr. Collene Mares for colonoscopy.  Reminded importance of scheduling, colon cancer screening  Unspecified vitamin D deficiency  Encounter for long-term (current) use of other medications - Plan: Comprehensive metabolic panel, Vit D  25 hydroxy (rtn osteoporosis monitoring)  Ringworm of foot - possible.  treat with OTC antifungals.  Acute upper respiratory infections of unspecified site  URI--no evidence of sinus infection  yet. Asthma--slight flare with URI, but responding nicely to rare use of albuterol.   Discussed monthly self breast exams and yearly mammograms after the age of 29; at least 30 minutes of aerobic activity at least 5 days/week; proper sunscreen use reviewed; healthy diet, including goals of calcium and vitamin D intake and alcohol recommendations (less than or equal to 1 drink/day) reviewed; regular seatbelt use; changing batteries in smoke detectors.  Immunization recommendations discussed.  Colonoscopy recommendations reviewed--pt to call to schedule colonoscopy  Drink plenty of fluids. Use decongestants such as sudafed to help with sinus pressure and dry up the mucus. Use expectorant (guaifenesin--in robitussin and Mucinex) to keep the phlegm looser. Use sinus rinses (ie Netipot) once or twice daily as needed. Call for antibiotics if your sinus pressure gets worse, mucus remains discolored/darker/worse.

## 2014-01-01 NOTE — Patient Instructions (Addendum)
  HEALTH MAINTENANCE RECOMMENDATIONS:  It is recommended that you get at least 30 minutes of aerobic exercise at least 5 days/week (for weight loss, you may need as much as 60-90 minutes). This can be any activity that gets your heart rate up. This can be divided in 10-15 minute intervals if needed, but try and build up your endurance at least once a week.  Weight bearing exercise is also recommended twice weekly.  Eat a healthy diet with lots of vegetables, fruits and fiber.  "Colorful" foods have a lot of vitamins (ie green vegetables, tomatoes, red peppers, etc).  Limit sweet tea, regular sodas and alcoholic beverages, all of which has a lot of calories and sugar.  Up to 1 alcoholic drink daily may be beneficial for women (unless trying to lose weight, watch sugars).  Drink a lot of water.  Calcium recommendations are 1200-1500 mg daily (1500 mg for postmenopausal women or women without ovaries), and vitamin D 1000 IU daily.  This should be obtained from diet and/or supplements (vitamins), and calcium should not be taken all at once, but in divided doses.  Monthly self breast exams and yearly mammograms for women over the age of 10 is recommended.  Sunscreen of at least SPF 30 should be used on all sun-exposed parts of the skin when outside between the hours of 10 am and 4 pm (not just when at beach or pool, but even with exercise, golf, tennis, and yard work!)  Use a sunscreen that says "broad spectrum" so it covers both UVA and UVB rays, and make sure to reapply every 1-2 hours.  Remember to change the batteries in your smoke detectors when changing your clock times in the spring and fall.  Use your seat belt every time you are in a car, and please drive safely and not be distracted with cell phones and texting while driving.   Please call Dr. Collene Mares and schedule your colonoscopy.  Even if your prep on your last one (from 2010) had been good, it is now time for another (every 5 years, at least,  due to your family history of cancer). Let me know if there are cost/insurance issues, and if you need referral elsewhere.  Please get your husband to quit smoking again--both for his health, and yours (you have asthma!)  Available resources to help him quit include free counseling through [facility] ([place]Quitline.[facility] or 1-800-QUITNOW), smoking cessation classes through [facility] (call to find out schedule), over-the-counter nicotine replacements, and e-cigarettes (although this may not help break the hand-mouth habit).  Many insurance companies also have smoking cessation programs (which may decrease the cost of patches, meds if enrolled).   Drink plenty of fluids. Use decongestants such as sudafed to help with sinus pressure and dry up the mucus. Use expectorant (guaifenesin--in robitussin and Mucinex) to keep the phlegm looser. Use sinus rinses (ie Netipot) once or twice daily as needed. Call for antibiotics if your sinus pressure gets worse, mucus remains discolored/darker/worse.  Possible ringworm right foot.  Try clotrimazole cream or lamisil cream twice daily for 2-3 weeks, until resolved.

## 2014-01-02 ENCOUNTER — Encounter: Payer: Self-pay | Admitting: Family Medicine

## 2014-01-02 LAB — TSH: TSH: 1.685 u[IU]/mL (ref 0.350–4.500)

## 2014-01-02 LAB — VITAMIN D 25 HYDROXY (VIT D DEFICIENCY, FRACTURES): VIT D 25 HYDROXY: 36 ng/mL (ref 30–89)

## 2014-04-15 ENCOUNTER — Telehealth: Payer: Self-pay | Admitting: Family Medicine

## 2014-04-15 DIAGNOSIS — F411 Generalized anxiety disorder: Secondary | ICD-10-CM

## 2014-04-15 MED ORDER — ALPRAZOLAM 0.5 MG PO TABS: 0.2500 mg | ORAL_TABLET | Freq: Three times a day (TID) | ORAL | Status: DC | PRN

## 2014-04-15 NOTE — Telephone Encounter (Signed)
Phoned in.

## 2014-04-15 NOTE — Telephone Encounter (Signed)
Ok for #30, no refill 

## 2014-06-15 ENCOUNTER — Ambulatory Visit (INDEPENDENT_AMBULATORY_CARE_PROVIDER_SITE_OTHER): Payer: BC Managed Care – PPO | Admitting: Family Medicine

## 2014-06-15 ENCOUNTER — Encounter: Payer: Self-pay | Admitting: Family Medicine

## 2014-06-15 VITALS — BP 130/80 | HR 84 | Temp 98.6°F | Ht 65.0 in | Wt 183.0 lb

## 2014-06-15 DIAGNOSIS — IMO0001 Reserved for inherently not codable concepts without codable children: Secondary | ICD-10-CM

## 2014-06-15 DIAGNOSIS — J069 Acute upper respiratory infection, unspecified: Secondary | ICD-10-CM

## 2014-06-15 DIAGNOSIS — S61409A Unspecified open wound of unspecified hand, initial encounter: Secondary | ICD-10-CM

## 2014-06-15 DIAGNOSIS — S61452A Open bite of left hand, initial encounter: Secondary | ICD-10-CM

## 2014-06-15 DIAGNOSIS — F411 Generalized anxiety disorder: Secondary | ICD-10-CM

## 2014-06-15 DIAGNOSIS — W5501XA Bitten by cat, initial encounter: Principal | ICD-10-CM

## 2014-06-15 DIAGNOSIS — J309 Allergic rhinitis, unspecified: Secondary | ICD-10-CM

## 2014-06-15 MED ORDER — AMOXICILLIN-POT CLAVULANATE 875-125 MG PO TABS: 1.0000 | ORAL_TABLET | Freq: Two times a day (BID) | ORAL | Status: DC

## 2014-06-15 MED ORDER — FLUTICASONE PROPIONATE 50 MCG/ACT NA SUSP: 2.0000 | Freq: Every day | NASAL | Status: AC

## 2014-06-15 MED ORDER — ALPRAZOLAM 0.5 MG PO TABS: 0.2500 mg | ORAL_TABLET | Freq: Three times a day (TID) | ORAL | Status: DC | PRN

## 2014-06-15 MED ORDER — FLUCONAZOLE 150 MG PO TABS: 150.0000 mg | ORAL_TABLET | Freq: Once | ORAL | Status: DC

## 2014-06-15 NOTE — Patient Instructions (Signed)
  Drink plenty of fluids. Start Mucinex. Consider sinus rinses; Consider restarting Flonase soon, especially if you tend to get Fall allergies.   Anxiety--use xanax sparingly, and work on other stress reduction techniques.

## 2014-06-15 NOTE — Progress Notes (Signed)
Chief Complaint  Patient presents with  . Animal Bite    left hand cat bite yesterday morning. Also complaining of nasal congestion and ear fullness, right side if you check that out for her.  Father passed away Jun 21, 2023, has been travelling and dealing with his death and would like to know if you could refill her xanax.    When coming back from her dad's funeral, the cat got into the ductwork, got into the crawlspace.  She pulled the cat out by her paw, and the cat bit her left hand, puncturing the back of her hand, and the palmar surface of the thenar eminence.  It was bleeding a lot; she cleansed it with hydrogen peroxide, witch hazel and soaked in epsom salts.  The back of the hand remains very sore, but less red and swollen than it was yesterday.  Even this morning she could barely see her knuckles--better now. Denies fevers.  Before going to the funeral (8/10) she started with some sore throat, pain on the right side to swallow, and discomfort in her right ear. It seems to hurt less to swallow, but still having right ear discomfort, and congestion.  Nasal drainage is clear, thick, "clumpy". She has been taking xicam and vitamin C.  She hasn't needed any albuterol.   Stressful week, relating to her father's death/funeral.  Hot flashes seem to be improving, which is when she would usually feel anxious.  She took xanax last week (while in [place] for dad' funeral) to help her sleep.  Hasn't needed it with her recent plane flights.  She has 2 pills left and is requesting a refill.  She will be flying back to [place] in a couple of weeks.  Past Medical History  Diagnosis Date  . Asthma   . Unspecified vitamin D deficiency 05/2009  . Pure hyperglyceridemia   . Anxiety   . Allergy     seasonal  . Family hx of colon cancer     mom   Past Surgical History  Procedure Laterality Date  . Colonoscopy  08/2009    inadequate prep.  Rec re-do with 3 day prep within 6 mos (Dr. Collene Mares)  . Cholecystectomy  84    History   Social History  . Marital Status: Married    Spouse Name: N/A    Number of Children: 0  . Years of Education: N/A   Occupational History  . [employer] ([employer]---name changed after merger) [place] Topics  . Smoking status: Never Smoker   . Smokeless tobacco: Never Used  . Alcohol Use: 0.6 oz/week    1 Cans of beer per week  . Drug Use: No  . Sexual Activity: Yes    Partners: Male    Birth Control/ Protection: None   Other Topics Concern  . Not on file   Social History Narrative   Lives with her husband, 15 cats and 3 dogs. Husband smokes outside (recently restarted 2014)   Outpatient Encounter Prescriptions as of 06/15/2014  Medication Sig  . ALPRAZolam (XANAX) 0.5 MG tablet Take 0.5-1 tablets (0.25-0.5 mg total) by mouth 3 (three) times daily as needed for anxiety.  . Ascorbic Acid (VITAMIN C) 1000 MG tablet Take 1,000 mg by mouth daily.  . budesonide-formoterol (SYMBICORT) 160-4.5 MCG/ACT inhaler Inhale 2 puffs into the lungs 2 (two) times daily.  . montelukast (SINGULAIR) 10 MG tablet Take 1  tablet (10 mg total) by mouth at bedtime.  . naproxen sodium (ANAPROX) 220 MG tablet Take 440 mg by mouth as needed.  . Probiotic Product (PROBIOTIC DAILY PO) Take 1 tablet by mouth daily.   . sertraline (ZOLOFT) 50 MG tablet TAKE 1 BY MOUTH DAILY  . [DISCONTINUED] ALPRAZolam (XANAX) 0.5 MG tablet Take 0.5-1 tablets (0.25-0.5 mg total) by mouth 3 (three) times daily as needed for anxiety.  . [DISCONTINUED] ibuprofen (ADVIL,MOTRIN) 800 MG tablet Take 1 tablet (800 mg total) by mouth 3 (three) times daily.  . [DISCONTINUED] Nutritional Supplements (JUICE PLUS FIBRE PO) Take 2 tablets by mouth daily.   Marland Kitchen albuterol (PROVENTIL HFA;VENTOLIN HFA) 108 (90 BASE) MCG/ACT inhaler Inhale 2 puffs into the lungs every 6 (six) hours as needed for wheezing.  Marland Kitchen amoxicillin-clavulanate (AUGMENTIN)  875-125 MG per tablet Take 1 tablet by mouth 2 (two) times daily.  . fluconazole (DIFLUCAN) 150 MG tablet Take 1 tablet (150 mg total) by mouth once. Take 1 tablet by mouth for yeast infection.  Repeat in 1 week if needed  . fluticasone (FLONASE) 50 MCG/ACT nasal spray Place 2 sprays into both nostrils daily.  (Augmentin and diflucan rx'd today)  Allergies  Allergen Reactions  . Erythromycin Nausea And Vomiting   ROS:  Denies fevers, chills, nausea, vomiting, GI or GU complaints.  Not currently with yeast infection, but typically gets from ABX, and often needs second tablet  Denies chest pain, shortness of breath.  +anxiety.  No depression, moods okay overall. No other skin concerns or rashes.  See HPI  PHYSICAL EXAM: BP 130/80  Pulse 84  Temp(Src) 98.6 F (37 C) (Tympanic)  Ht 5\' 5"  (1.651 m)  Wt 183 lb (83.008 kg)  BMI 30.45 kg/m2  Well developed, pleasant female in no distress HEENT:  PERRL, EOMI, conjunctiva clear.  TM's and EAC's normal.  Nasal mucosa is mildly edematous with clear mucus.  Sinuses nontender. OP is clear--very slight erythema of upper uvula, but no thrush. Neck: no lymphadenopathy or mass Heart: regular rate and rhythm without murmur Lungs: clear bilaterally Left hand--puncture wound in thenar eminence without any soft tissue swelling or erythema.  Another very shallow puncture vs excoriation at the base of the thumb and shallow puncture vs excoriations at the 3rd proximal phalynx, and 4th MCP.  Minimal erythema over 3rd and 4th knuckles, but no warmth, soft tissue swelling or streaks. 2+ pulses   ASSESSMENT/PLAN:  Cat bite of hand, left, initial encounter - no e/o infection.  Treat with augmentin prophylactically.  diflucan to use prn for yeast, repeat in a week if needed.  f/u if increasing swelling/pain - Plan: amoxicillin-clavulanate (AUGMENTIN) 875-125 MG per tablet, fluconazole (DIFLUCAN) 150 MG tablet  Anxiety state, unspecified - refilled xanax;  encouraged to use it sparingly, use other stress reduction techniques (for work stress) - Plan: ALPRAZolam (XANAX) 0.5 MG tablet  Acute upper respiratory infections of unspecified site - supportive measures reviewed.  no e/o bacterial infection on exam  Allergic rhinitis, cause unspecified - restart Flonase - Plan: fluticasone (FLONASE) 50 MCG/ACT nasal spray   Cat bite--swelling significantly improved.  She cleansed it well--no evidence of infection but will treat prophylactically with Augmentin Augmentin 875mg  BID x 1 week for prevention  URI vs allergies.  No evidence of acute infection.  Drink plenty of fluids. Start Mucinex. Consider sinus rinses; Consider restarting Flonase soon, especially if you tend to get Fall allergies.   Anxiety--refill xanax. Last filled #30 2 months ago.  Use sparingly,  and work on other stress reduction techniques.

## 2014-12-14 ENCOUNTER — Ambulatory Visit (INDEPENDENT_AMBULATORY_CARE_PROVIDER_SITE_OTHER): Payer: 59 | Admitting: Family Medicine

## 2014-12-14 ENCOUNTER — Encounter: Payer: Self-pay | Admitting: Family Medicine

## 2014-12-14 VITALS — BP 120/84 | HR 60 | Ht 65.0 in | Wt 190.0 lb

## 2014-12-14 DIAGNOSIS — L989 Disorder of the skin and subcutaneous tissue, unspecified: Secondary | ICD-10-CM

## 2014-12-14 NOTE — Progress Notes (Signed)
Chief Complaint  Patient presents with  . Mass    mole on right hand that has been there for many years, noticed over the last few days that it is changing color (purple in color).    Has had a small bump, almost like a skin tag, present for a little under a year on the back of her right hand.  It was clear/not colored.  She noticed that it was purple in color, and a little sore to the touch over the last few days.  No known trauma or injury.   She was going to lance it to get out whatever is in there, but preferred to have a doctor do it.  Presents to have this lesion opened.  PMH, PSH, SH reviewed Meds reviewed Allergies  Allergen Reactions  . Erythromycin Nausea And Vomiting   ROS: no URI symptoms, fevers, rash, chest pain, shortness of breath, GI complaints, mood changes or other concerns.  PHYSICAL EXAM: BP 120/84 mmHg  Pulse 60  Ht 5\' 5"  (1.651 m)  Wt 190 lb (86.183 kg)  BMI 31.62 kg/m2  Well developed, pleasant female in good spirits RUE:  posterior aspect of right hand, on ulnar portion, at the wrist (just proximal, actually located between the base of the 4th and 5th metacarpal bones). There is a 41mm area of redness with central portion raised and purple discoloration.  There is no pustule, but appears very superfcial, cannot r/o anything under the skin (foreign body? Pustule?) It is very superficial, mildly tender to palpation. There is no surrounding erythema, soft tissue swelling or other abnormality.  Normal pulse and brisk capillary refill  ASSESSMENT/PLAN:   Skin lesion of hand - right  Pt desires I&D/incision of this lesions.  Area was cleansed, anesthetized with 2% lidocaine, and 11 blade was used to lance the superficial  Skin overlying the tender area.  There was mild oozing of blood, but no other drainage noted--no purulence or foreign body. Pt reassured that lesions not concerning for cancer or infection.  Recommended keeping it clean, using antibacterial  ointment and watch/wait.  Reviewed s/sx of infection and return if they develop. Otherwise, if doesn't completely resolve and is bothersome to patient. Punch biopsy for excision is recommended.  F/u as previously scheduled in the next few weeks

## 2014-12-14 NOTE — Patient Instructions (Signed)
Keep the area clean.  Use antibacterial ointment until skin has healed. If there is any persistent lesion of concern, return for punch biopsy to completely excise the lesion.

## 2015-01-04 ENCOUNTER — Ambulatory Visit (INDEPENDENT_AMBULATORY_CARE_PROVIDER_SITE_OTHER): Payer: 59 | Admitting: Family Medicine

## 2015-01-04 ENCOUNTER — Encounter: Payer: Self-pay | Admitting: Family Medicine

## 2015-01-04 VITALS — BP 120/70 | HR 72 | Ht 66.0 in | Wt 186.0 lb

## 2015-01-04 DIAGNOSIS — M25551 Pain in right hip: Secondary | ICD-10-CM

## 2015-01-04 DIAGNOSIS — J45998 Other asthma: Secondary | ICD-10-CM

## 2015-01-04 DIAGNOSIS — F411 Generalized anxiety disorder: Secondary | ICD-10-CM | POA: Diagnosis not present

## 2015-01-04 DIAGNOSIS — Z205 Contact with and (suspected) exposure to viral hepatitis: Secondary | ICD-10-CM

## 2015-01-04 DIAGNOSIS — E781 Pure hyperglyceridemia: Secondary | ICD-10-CM

## 2015-01-04 DIAGNOSIS — M7631 Iliotibial band syndrome, right leg: Secondary | ICD-10-CM

## 2015-01-04 DIAGNOSIS — K5900 Constipation, unspecified: Secondary | ICD-10-CM | POA: Diagnosis not present

## 2015-01-04 DIAGNOSIS — M533 Sacrococcygeal disorders, not elsewhere classified: Secondary | ICD-10-CM | POA: Diagnosis not present

## 2015-01-04 DIAGNOSIS — J309 Allergic rhinitis, unspecified: Secondary | ICD-10-CM | POA: Diagnosis not present

## 2015-01-04 DIAGNOSIS — Z0000 Encounter for general adult medical examination without abnormal findings: Secondary | ICD-10-CM | POA: Diagnosis not present

## 2015-01-04 LAB — CBC WITH DIFFERENTIAL/PLATELET
Basophils Absolute: 0.1 10*3/uL (ref 0.0–0.1)
Basophils Relative: 1 % (ref 0–1)
EOS ABS: 0.2 10*3/uL (ref 0.0–0.7)
EOS PCT: 4 % (ref 0–5)
HEMATOCRIT: 43.1 % (ref 36.0–46.0)
HEMOGLOBIN: 14.5 g/dL (ref 12.0–15.0)
Lymphocytes Relative: 28 % (ref 12–46)
Lymphs Abs: 1.7 10*3/uL (ref 0.7–4.0)
MCH: 30.6 pg (ref 26.0–34.0)
MCHC: 33.6 g/dL (ref 30.0–36.0)
MCV: 90.9 fL (ref 78.0–100.0)
MONO ABS: 0.5 10*3/uL (ref 0.1–1.0)
MONOS PCT: 8 % (ref 3–12)
MPV: 10 fL (ref 8.6–12.4)
Neutro Abs: 3.5 10*3/uL (ref 1.7–7.7)
Neutrophils Relative %: 59 % (ref 43–77)
Platelets: 299 10*3/uL (ref 150–400)
RBC: 4.74 MIL/uL (ref 3.87–5.11)
RDW: 13.2 % (ref 11.5–15.5)
WBC: 6 10*3/uL (ref 4.0–10.5)

## 2015-01-04 LAB — COMPREHENSIVE METABOLIC PANEL
ALK PHOS: 88 U/L (ref 39–117)
ALT: 30 U/L (ref 0–35)
AST: 24 U/L (ref 0–37)
Albumin: 4.7 g/dL (ref 3.5–5.2)
BILIRUBIN TOTAL: 0.7 mg/dL (ref 0.2–1.2)
BUN: 14 mg/dL (ref 6–23)
CO2: 25 mEq/L (ref 19–32)
Calcium: 10 mg/dL (ref 8.4–10.5)
Chloride: 102 mEq/L (ref 96–112)
Creat: 0.69 mg/dL (ref 0.50–1.10)
Glucose, Bld: 84 mg/dL (ref 70–99)
POTASSIUM: 4.5 meq/L (ref 3.5–5.3)
SODIUM: 140 meq/L (ref 135–145)
Total Protein: 7.2 g/dL (ref 6.0–8.3)

## 2015-01-04 LAB — POCT URINALYSIS DIPSTICK
Bilirubin, UA: NEGATIVE
Glucose, UA: NEGATIVE
Ketones, UA: NEGATIVE
Nitrite, UA: NEGATIVE
Protein, UA: NEGATIVE
RBC UA: NEGATIVE
Spec Grav, UA: 1.02
Urobilinogen, UA: NEGATIVE
pH, UA: 6.5

## 2015-01-04 LAB — TSH: TSH: 1.369 u[IU]/mL (ref 0.350–4.500)

## 2015-01-04 LAB — LIPID PANEL
Cholesterol: 245 mg/dL — ABNORMAL HIGH (ref 0–200)
HDL: 68 mg/dL (ref >=46)
LDL Cholesterol: 154 mg/dL — ABNORMAL HIGH (ref 0–99)
Total CHOL/HDL Ratio: 3.6 Ratio
Triglycerides: 115 mg/dL (ref <150)
VLDL: 23 mg/dL (ref 0–40)

## 2015-01-04 MED ORDER — SERTRALINE HCL 50 MG PO TABS: ORAL_TABLET | ORAL | Status: DC

## 2015-01-04 MED ORDER — ALPRAZOLAM 0.5 MG PO TABS: 0.2500 mg | ORAL_TABLET | Freq: Three times a day (TID) | ORAL | Status: DC | PRN

## 2015-01-04 MED ORDER — NAPROXEN 500 MG PO TABS: 500.0000 mg | ORAL_TABLET | Freq: Two times a day (BID) | ORAL | Status: DC

## 2015-01-04 MED ORDER — BUDESONIDE-FORMOTEROL FUMARATE 160-4.5 MCG/ACT IN AERO: 2.0000 | INHALATION_SPRAY | Freq: Two times a day (BID) | RESPIRATORY_TRACT | Status: DC

## 2015-01-04 MED ORDER — MONTELUKAST SODIUM 10 MG PO TABS: 10.0000 mg | ORAL_TABLET | Freq: Every day | ORAL | Status: DC

## 2015-01-04 NOTE — Progress Notes (Signed)
Chief Complaint  Patient presents with  . Annual Exam    fasting annual exam with pelvic/med check. Has a pain in her right hip pain x few months. Also some constipation issues.    Hannah Werner is a 54 y.o. female who presents for a complete physical.  She has the following concerns:  Pain in right hip:  She has had pain off and on for a long time related to an old injury.  Increased pain over the last month, sometimes wakes her up at night.  She has pain in the right groin, which sometimes radiates around to her back, and down the back of her leg and to her foot.  Sometimes has some tingling, denies weakness.  She has seen chiropactor Ellard Artis at [facility]) a couple of times before Christmas, who has showed her some stretches which she continues to do, and is helpful, but pain recurs.  Feels better with activity.  Feels worse in the morning and prolonged sitting.  She has taken Aleve PM before flying and at night, only daytime Aleve sporadically, if really bad.  R shoulder pain--resolved s/p therapy from chiropractor.  Constipation:  Started a couple of months ago.  She has been drinking green tea which has been helpful.  Recently has been having daily stools.  It was worse when she was traveling, and has been traveling a lot recently for work.   F/u bump on her right hand--see February visit. This has healed, and is even smaller than it had been before.  Not tender or painful  Anxiety: She remains on Sertraline.  Right before a hot flash she gets a very anxious feeling, but it is short lived.  Worse at night.  Denies side effects. Only uses xanax with travel/flying, rarely needs it otherwise.   Asthma and allergies:  Doing well overall.  Slight nasal congestion related to some travel to [place] (change in climate, always worse there).  Breathing is good--hasn't needed to use a rescue inhaler in many months.  Immunization History  Administered Date(s) Administered  . DT 04/19/2000  . Influenza  Split 08/14/2011  . Influenza, Seasonal, Injecte, Preservative Fre 12/23/2012  . Influenza-Unspecified 08/01/2013  . Pneumococcal Polysaccharide-23 04/05/2011  . Tdap 04/05/2011  . Zoster 11/06/2013  got flu shot at work 07/2014 Last Pap smear: 11/2012 Last mammogram: 10/2013 Last colonoscopy: poor prep 2010, never went back to have it redone (Dr. Collene Mares). She plans to switch to Dr. Paulita Fujita, who her husband sees, and will be calling today to get it scheduled (has gotten records from Dr. Collene Mares transferred) Last DEXA: never  Dentist: twice early  Ophtho: yearly  Exercise: walking the dogs daily, at least 30 minutes, 2-3 nights/week at the gym (weights, stretches, 20 mins elliptical)  Normal vitamin D level 12/2013 H/o elevated TG, but last lipids normal (2 year ago) Lab Results  Component Value Date   CHOL 180 12/23/2012   HDL 74 12/23/2012   [place] 80 12/23/2012   TRIG 129 12/23/2012   CHOLHDL 2.4 12/23/2012   Had HIV, RPR and Hepatitis screening done in 07/2005.   Husband has Hep C (and just started treatment).  Hasn't been checked since then.  Past Medical History  Diagnosis Date  . Asthma   . Unspecified vitamin D deficiency 05/2009  . Pure hyperglyceridemia   . Anxiety   . Allergy     seasonal  . Family hx of colon cancer     mom    Past Surgical History  Procedure Laterality Date  . Colonoscopy  08/2009    inadequate prep.  Rec re-do with 3 day prep within 6 mos (Dr. Collene Mares)  . Cholecystectomy  84    History   Social History  . Marital Status: Married    Spouse Name: N/A  . Number of Children: 0  . Years of Education: N/A   Occupational History  . [employer]---name changed after [employer]) [place] [employer] Topics  . Smoking status: Never Smoker   . Smokeless tobacco: Never Used  . Alcohol Use: 0.6 oz/week    1 Cans of beer per week     Comment: 1-2/week  . Drug Use:  No  . Sexual Activity:    Partners: Male    Birth Control/ Protection: None   Other Topics Concern  . Not on file   Social History Narrative   Lives with her husband, 16 cats and 2 dogs. Husband smokes outside (recently restarted 2014)    Family History  Problem Relation Age of Onset  . Diabetes Mother   . Cancer Mother 12    colon cancer  . Colon cancer Mother 62  . Heart disease Father     CABG _0   . Hyperlipidemia Father   . Anxiety disorder Father   . Cancer Father     CLL  . Diabetes Sister   . Stroke Maternal Grandfather   . Asthma Neg Hx   . Diabetes Sister   . Seizures Sister     Outpatient Encounter Prescriptions as of 01/04/2015  Medication Sig Note  . Ascorbic Acid (VITAMIN C) 1000 MG tablet Take 1,000 mg by mouth daily.   . budesonide-formoterol (SYMBICORT) 160-4.5 MCG/ACT inhaler Inhale 2 puffs into the lungs 2 (two) times daily.   . fluticasone (FLONASE) 50 MCG/ACT nasal spray Place 2 sprays into both nostrils daily.   . montelukast (SINGULAIR) 10 MG tablet Take 1 tablet (10 mg total) by mouth at bedtime.   . Probiotic Product (PROBIOTIC DAILY PO) Take 1 tablet by mouth daily.    . sertraline (ZOLOFT) 50 MG tablet TAKE 1 BY MOUTH DAILY   . albuterol (PROVENTIL HFA;VENTOLIN HFA) 108 (90 BASE) MCG/ACT inhaler Inhale 2 puffs into the lungs every 6 (six) hours as needed for wheezing. (Patient not taking: Reported on 12/14/2014)   . ALPRAZolam (XANAX) 0.5 MG tablet Take 0.5-1 tablets (0.25-0.5 mg total) by mouth 3 (three) times daily as needed for anxiety. (Patient not taking: Reported on 12/14/2014) 01/04/2015: Uses with flying   Allergies  Allergen Reactions  . Erythromycin Nausea And Vomiting   ROS: The patient denies anorexia, fever, headaches, vision changes, decreased hearing, ear pain, sore throat, breast concerns, chest pain, dizziness, syncope, dyspnea on exertion, swelling, nausea, vomiting, diarrhea, abdominal pain, melena, hematochezia, hematuria,  incontinence, dysuria, vaginal bleeding (none in 2 years, since stopping OCP's), discharge, odor or itch, genital lesions, numbness, weakness, tremor, suspicious skin lesions, depression, abnormal bleeding/bruising, or enlarged lymph nodes.  Some finger stiffness (4th and 5th fingers bilaterally), mild pain. Unchanged No longer having any knee pain.  Right shoulder pain has also resolved. +right hip pain, as per HPI Some mild constipation recently, related to travel, not improved Hot flashes have tapered off just a little, but still very bothersome in the evening. No night sweats.   PHYSICAL EXAM:  BP 120/70 mmHg  Pulse 72  Ht _1  (1.676 m)  Wt 186  lb (84.369 kg)  BMI 30.04 kg/m2  General Appearance:  Alert, cooperative, no distress, appears stated age   Head:  Normocephalic, without obvious abnormality, atraumatic   Eyes:  PERRL, conjunctiva/corneas clear, EOM's intact, fundi  benign   Ears:  Normal TM's and external ear canals   Nose:  Nares normal; nasal mucosa is mild-mod edematous with clear mucus on the left; sinuses nontender  Throat:  Lips, mucosa, and tongue normal; teeth and gums normal. Not wearing partials today (posterior upper teeth absent)  Neck:  Supple, no lymphadenopathy; thyroid: no enlargement/tenderness/nodules; no carotid  bruit or JVD   Back:  Spine nontender, no curvature, ROM normal, no CVA tenderness   Lungs:  Clear to auscultation bilaterally without wheezes, rales or ronchi; respirations unlabored   Chest Wall:  No tenderness or deformity   Heart:  Regular rate and rhythm, S1 and S2 normal, no murmur, rub  or gallop   Breast Exam:  No tenderness, masses, or nipple discharge or inversion. No axillary lymphadenopathy   Abdomen:  Soft, non-tender, nondistended, normoactive bowel sounds,  no masses, no hepatosplenomegaly   Genitalia:  Normal external genitalia without lesions. BUS and vagina normal; no cervical motion  tenderness. No abnormal vaginal discharge. Uterus and adnexa not enlarged, nontender, no masses. Pap not performed. Hard stool palpable posteriorly   Rectal:  Normal tone, no masses or tenderness; guaiac negative stool; nonbleeding external hemorrhoid   Extremities:  No clubbing, cyanosis or edema. Tender at right SI joint and sciatic notch, some discomfort with pyriformis stretch, but normal ROM No pain with ROM of the right hip nontender at trochanteric bursa.  Pain with IT band stretches on the right  Pulses:  2+ and symmetric all extremities   Skin:  Skin color, texture, turgor normal.   Lymph nodes:  Cervical, supraclavicular, and axillary nodes normal   Neurologic:  CNII-XII intact, normal strength, sensation and gait; reflexes 2+ and symmetric throughout   Psych:   Normal mood, affect, hygiene and grooming           ASSESSMENT/PLAN:  Annual physical exam - Plan: POCT Urinalysis Dipstick, Comprehensive metabolic panel, Lipid panel, CBC with Differential/Platelet, TSH  Pure hyperglyceridemia - mild, normal on last check.  Due for check - Plan: Lipid panel  Anxiety state - anxiety with travel (xanax prn), and prior to hot flashes.  Recommended trial of [place]ro[place].  Continue Sertraline - Plan: ALPRAZolam (XANAX) 0.5 MG tablet, sertraline (ZOLOFT) 50 MG tablet  Allergic rhinitis, unspecified allergic rhinitis type - controlled  Exposure to hepatitis C - Plan: Hepatitis C Ab Reflex HCV RNA, QUANT  Constipation, unspecified constipation type - related to travel. Discussed adequate fluid and fiber intake, regular exercise, stool softeners w/travel. 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 NSAID, stretches. consider f/u w/chiro if not improving  IT band syndrome, right - stretches shown  Asthma in  remission  Stretches shown (IT band, pyriformis).  F/u with chiro for ongoing pain. Trial of NSAIDs for 1-2 weeks.  Discussed monthly self breast exams and yearly mammograms; at least 30 minutes of aerobic activity at least 5 days/week ,weight-bearing exercise 2x/wk; proper sunscreen use reviewed; healthy diet, including goals of calcium and vitamin D intake and alcohol recommendations (less than or equal to 1 drink/day) reviewed; regular seatbelt use; changing batteries in smoke detectors. Immunization recommendations discussed--continue yearly flu shots. Colonoscopy recommendations reviewed--pt to  call to schedule colonoscopy  c-met, TSH, lipid, CBC, Hep C Ab  F/u 1 year, sooner prn

## 2015-01-04 NOTE — Patient Instructions (Addendum)
  HEALTH MAINTENANCE RECOMMENDATIONS:  It is recommended that you get at least 30 minutes of aerobic exercise at least 5 days/week (for weight loss, you may need as much as 60-90 minutes). This can be any activity that gets your heart rate up. This can be divided in 10-15 minute intervals if needed, but try and build up your endurance at least once a week.  Weight bearing exercise is also recommended twice weekly.  Eat a healthy diet with lots of vegetables, fruits and fiber.  "Colorful" foods have a lot of vitamins (ie green vegetables, tomatoes, red peppers, etc).  Limit sweet tea, regular sodas and alcoholic beverages, all of which has a lot of calories and sugar.  Up to 1 alcoholic drink daily may be beneficial for women (unless trying to lose weight, watch sugars).  Drink a lot of water.  Calcium recommendations are 1200-1500 mg daily (1500 mg for postmenopausal women or women without ovaries), and vitamin D 1000 IU daily.  This should be obtained from diet and/or supplements (vitamins), and calcium should not be taken all at once, but in divided doses.  Monthly self breast exams and yearly mammograms for women over the age of 33 is recommended.  Sunscreen of at least SPF 30 should be used on all sun-exposed parts of the skin when outside between the hours of 10 am and 4 pm (not just when at beach or pool, but even with exercise, golf, tennis, and yard work!)  Use a sunscreen that says "broad spectrum" so it covers both UVA and UVB rays, and make sure to reapply every 1-2 hours.  Remember to change the batteries in your smoke detectors when changing your clock times in the spring and fall.  Use your seat belt every time you are in a car, and please drive safely and not be distracted with cell phones and texting while driving.  Please schedule your colonoscopy. Call and schedule your mammogram  Yearly flu shots are recommended in the Fall (ok to get at work, just let us know when you get  it. Consider trying black cohosh and soy to help with hot flashes [facility], I believe, has both)

## 2015-01-05 LAB — HEPATITIS C ANTIBODY: HCV Ab: NEGATIVE

## 2015-04-15 ENCOUNTER — Emergency Department (HOSPITAL_COMMUNITY): Payer: 59

## 2015-04-15 ENCOUNTER — Emergency Department (HOSPITAL_COMMUNITY)
Admission: EM | Admit: 2015-04-15 | Discharge: 2015-04-15 | Disposition: A | Discharge status: Home / Self Care | Payer: 59 | Attending: Emergency Medicine | Admitting: Emergency Medicine

## 2015-04-15 ENCOUNTER — Encounter (HOSPITAL_COMMUNITY): Payer: Self-pay | Admitting: Emergency Medicine

## 2015-04-15 DIAGNOSIS — R0789 Other chest pain: Secondary | ICD-10-CM | POA: Diagnosis not present

## 2015-04-15 DIAGNOSIS — J45901 Unspecified asthma with (acute) exacerbation: Secondary | ICD-10-CM | POA: Diagnosis not present

## 2015-04-15 DIAGNOSIS — R002 Palpitations: Secondary | ICD-10-CM | POA: Insufficient documentation

## 2015-04-15 DIAGNOSIS — R110 Nausea: Secondary | ICD-10-CM | POA: Diagnosis not present

## 2015-04-15 DIAGNOSIS — F419 Anxiety disorder, unspecified: Secondary | ICD-10-CM | POA: Insufficient documentation

## 2015-04-15 DIAGNOSIS — R079 Chest pain, unspecified: Secondary | ICD-10-CM | POA: Diagnosis present

## 2015-04-15 DIAGNOSIS — Z79899 Other long term (current) drug therapy: Secondary | ICD-10-CM | POA: Insufficient documentation

## 2015-04-15 DIAGNOSIS — Z7951 Long term (current) use of inhaled steroids: Secondary | ICD-10-CM | POA: Insufficient documentation

## 2015-04-15 DIAGNOSIS — Z8639 Personal history of other endocrine, nutritional and metabolic disease: Secondary | ICD-10-CM | POA: Diagnosis not present

## 2015-04-15 DIAGNOSIS — Z791 Long term (current) use of non-steroidal anti-inflammatories (NSAID): Secondary | ICD-10-CM | POA: Insufficient documentation

## 2015-04-15 LAB — I-STAT TROPONIN, ED
Troponin i, poc: 0 ng/mL (ref 0.00–0.08)
Troponin i, poc: 0 ng/mL (ref 0.00–0.08)

## 2015-04-15 LAB — BASIC METABOLIC PANEL
ANION GAP: 10 (ref 5–15)
BUN: 11 mg/dL (ref 6–20)
CALCIUM: 9.3 mg/dL (ref 8.9–10.3)
CHLORIDE: 100 mmol/L — AB (ref 101–111)
CO2: 25 mmol/L (ref 22–32)
CREATININE: 0.81 mg/dL (ref 0.44–1.00)
GFR calc Af Amer: >60 mL/min (ref >60)
GFR calc non Af Amer: >60 mL/min (ref >60)
Glucose, Bld: 131 mg/dL — ABNORMAL HIGH (ref 65–99)
Potassium: 4.1 mmol/L (ref 3.5–5.1)
SODIUM: 135 mmol/L (ref 135–145)

## 2015-04-15 LAB — CBC
HEMATOCRIT: 38.6 % (ref 36.0–46.0)
Hemoglobin: 13.4 g/dL (ref 12.0–15.0)
MCH: 30.5 pg (ref 26.0–34.0)
MCHC: 34.7 g/dL (ref 30.0–36.0)
MCV: 87.9 fL (ref 78.0–100.0)
Platelets: 264 10*3/uL (ref 150–400)
RBC: 4.39 MIL/uL (ref 3.87–5.11)
RDW: 12.2 % (ref 11.5–15.5)
WBC: 7.3 10*3/uL (ref 4.0–10.5)

## 2015-04-15 LAB — BRAIN NATRIURETIC PEPTIDE: B NATRIURETIC PEPTIDE 5: 6.4 pg/mL (ref 0.0–100.0)

## 2015-04-15 MED ORDER — ASPIRIN 81 MG PO CHEW: 81.0000 mg | CHEWABLE_TABLET | Freq: Every day | ORAL | Status: DC

## 2015-04-15 NOTE — Discharge Instructions (Signed)
Please follow with your primary care doctor in the next 2 days for a check-up. They must obtain records for further management.   Do not hesitate to return to the Emergency Department for any new, worsening or concerning symptoms.    Chest Pain (Nonspecific) It is often hard to give a diagnosis for the cause of chest pain. There is always a chance that your pain could be related to something serious, such as a heart attack or a blood clot in the lungs. You need to follow up with your doctor. HOME CARE  If antibiotic medicine was given, take it as directed by your doctor. Finish the medicine even if you start to feel better.  For the next few days, avoid activities that bring on chest pain. Continue physical activities as told by your doctor.  Do not use any tobacco products. This includes cigarettes, chewing tobacco, and e-cigarettes.  Avoid drinking alcohol.  Only take medicine as told by your doctor.  Follow your doctor's suggestions for more testing if your chest pain does not go away.  Keep all doctor visits you made. GET HELP IF:  Your chest pain does not go away, even after treatment.  You have a rash with blisters on your chest.  You have a fever. GET HELP RIGHT AWAY IF:   You have more pain or pain that spreads to your arm, neck, jaw, back, or belly (abdomen).  You have shortness of breath.  You cough more than usual or cough up blood.  You have very bad back or belly pain.  You feel sick to your stomach (nauseous) or throw up (vomit).  You have very bad weakness.  You pass out (faint).  You have chills. This is an emergency. Do not wait to see if the problems will go away. Call your local emergency services (911 in [place] Do not drive yourself to the hospital. MAKE SURE YOU:   Understand these instructions.  Will watch your condition.  Will get help right away if you are not doing well or get worse. Document Released: 04/03/2008 Document Revised:  10/21/2013 Document Reviewed: 04/03/2008 [facility] Patient Information 2015 [place], [place]. This information is not intended to replace advice given to you by your health care provider. Make sure you discuss any questions you have with your health care provider.

## 2015-04-15 NOTE — ED Notes (Signed)
Pt. reports central chest pain with SOB and nausea onset this evening , denies emesis or diaphoresis .

## 2015-04-15 NOTE — ED Provider Notes (Signed)
CSN: [id number]     Arrival date & time 04/15/15  0045 History   None    Chief Complaint  Patient presents with  . Chest Pain     (Consider location/radiation/quality/duration/timing/severity/associated sxs/prior Treatment) HPI  Hannah Werner is a 54 y.o. female c/o palpitations and pressure-like retrosternal chest pain, nonradiating, described as when you feel like you're falling when you are falling a sleep, rated at 8/10 at worst, 0/10 now lasting 1 hour onset at midnight when patient woke up 30 minutes before from a nightmare. Relieved spontaneously.  Pain has been constant, non-exertional, non-pleuritic or positional. Pain is associated with shortness of breath and nausea.. Denies emesis, diaphoresis, cough, fever, back pain, syncope, prior episodes, recent cocaine/methamphetimine use. Denies h/o DVT, PE,  recent travel, leg swelling, hemoptysis.  Pt has not received any ASA or NTG in the last 24 hours.  RF: FH: Father had CABG x10 years ago Last Stress test: ? Cardiologost: ? PCP: [employer]   Past Medical History  Diagnosis Date  . Asthma   . Unspecified vitamin D deficiency 05/2009  . Pure hyperglyceridemia   . Anxiety   . Allergy     seasonal  . Family hx of colon cancer     mom  . Exposure to hepatitis C     husband   Past Surgical History  Procedure Laterality Date  . Colonoscopy  08/2009    inadequate prep.  Rec re-do with 3 day prep within 6 mos (Dr. Collene Mares)  . Cholecystectomy  60   Family History  Problem Relation Age of Onset  . Diabetes Mother   . Cancer Mother 15    colon cancer  . Colon cancer Mother 33  . Heart disease Father     CABG @69   . Hyperlipidemia Father   . Anxiety disorder Father   . Cancer Father     CLL  . Diabetes Sister   . Stroke Maternal Grandfather   . Asthma Neg Hx   . Diabetes Sister   . Seizures Sister    History  Substance Use Topics  . Smoking status: Never Smoker   . Smokeless tobacco: Never Used  . Alcohol Use: Yes    OB History    Gravida Para Term Preterm AB TAB SAB Ectopic Multiple Living   0 0 0 0 0 0 0 0 0 0      Review of Systems  10 systems reviewed and found to be negative, except as noted in the HPI.   Allergies  Erythromycin  Home Medications   Prior to Admission medications   Medication Sig Start Date End Date Taking? Authorizing Provider  albuterol (PROVENTIL HFA;VENTOLIN HFA) 108 (90 BASE) MCG/ACT inhaler Inhale 1-2 puffs into the lungs every 6 (six) hours as needed for wheezing or shortness of breath.   Yes Historical Provider, MD  ALPRAZolam Duanne Moron) 0.5 MG tablet Take 0.5-1 tablets (0.25-0.5 mg total) by mouth 3 (three) times daily as needed for anxiety. 01/04/15  Yes Rita Ohara, MD  Ascorbic Acid (VITAMIN C) 1000 MG tablet Take 1,000 mg by mouth daily.   Yes Historical Provider, MD  budesonide-formoterol (SYMBICORT) 160-4.5 MCG/ACT inhaler Inhale 2 puffs into the lungs 2 (two) times daily. 01/04/15  Yes Rita Ohara, MD  fluticasone (FLONASE) 50 MCG/ACT nasal spray Place 2 sprays into both nostrils daily. 06/15/14  Yes Rita Ohara, MD  montelukast (SINGULAIR) 10 MG tablet Take 1 tablet (10 mg total) by mouth at bedtime. 01/04/15  Yes Rita Ohara, MD  naproxen (NAPROSYN) 500 MG tablet Take 1 tablet (500 mg total) by mouth 2 (two) times daily with a meal. 01/04/15  Yes Rita Ohara, MD  Probiotic Product (PROBIOTIC DAILY PO) Take 1 tablet by mouth daily.    Yes Historical Provider, MD  sertraline (ZOLOFT) 50 MG tablet TAKE 1 BY MOUTH DAILY 01/04/15  Yes Rita Ohara, MD  albuterol (PROVENTIL HFA;VENTOLIN HFA) 108 (90 BASE) MCG/ACT inhaler Inhale 2 puffs into the lungs every 6 (six) hours as needed for wheezing. Patient not taking: Reported on 12/14/2014 01/01/14 01/10/15  Rita Ohara, MD  aspirin 81 MG chewable tablet Chew 1 tablet (81 mg total) by mouth daily. 04/15/15   Irvin Bastin, PA-C   BP 106/65 mmHg  Pulse 58  Temp(Src) 97.9 F (36.6 C) (Oral)  Resp 18  SpO2 97% Physical Exam  Constitutional: She  is oriented to person, place, and time. She appears well-developed and well-nourished. No distress.  HENT:  Head: Normocephalic.  Mouth/Throat: Oropharynx is clear and moist.  Eyes: Conjunctivae are normal.  Neck: Normal range of motion. No JVD present. No tracheal deviation present.  Cardiovascular: Normal rate, regular rhythm and intact distal pulses.   Radial pulse equal bilaterally  Pulmonary/Chest: Effort normal and breath sounds normal. No stridor. No respiratory distress. She has no wheezes. She has no rales. She exhibits no tenderness.  Abdominal: Soft. She exhibits no distension and no mass. There is no tenderness. There is no rebound and no guarding.  Musculoskeletal: Normal range of motion. She exhibits no edema or tenderness.  No calf asymmetry, superficial collaterals, palpable cords, edema, Homans sign negative bilaterally.    Neurological: She is alert and oriented to person, place, and time.  Skin: Skin is warm. She is not diaphoretic.  Psychiatric: She has a normal mood and affect.  Nursing note and vitals reviewed.   ED Course  Procedures (including critical care time) Labs Review Labs Reviewed  BASIC METABOLIC PANEL - Abnormal; Notable for the following:    Chloride 100 (*)    Glucose, Bld 131 (*)    All other components within normal limits  CBC  BRAIN NATRIURETIC PEPTIDE  I-STAT TROPOININ, ED  Randolm Idol, ED    Imaging Review Dg Chest 2 View  04/15/2015   CLINICAL DATA:  Centralized chest pain with tingling feeling in the tongue tonight.  EXAM: CHEST  2 VIEW  COMPARISON:  None.  FINDINGS: The heart size and mediastinal contours are within normal limits. Both lungs are clear. The visualized skeletal structures are unremarkable.  IMPRESSION: No active cardiopulmonary disease.   Electronically Signed   By: Lucienne Capers M.D.   On: 04/15/2015 01:40     EKG Interpretation   Date/Time:  Thursday April 15 2015 00:49:21 EDT Ventricular Rate:  81 PR  Interval:  144 QRS Duration: 84 QT Interval:  392 QTC Calculation: 455 R Axis:   59 Text Interpretation:  Normal sinus rhythm Low voltage QRS Cannot rule out  Anterior infarct , age undetermined Abnormal ECG No old tracing to compare  Confirmed by WARD,  DO, KRISTEN ([id number]) on 04/15/2015 4:14:10 AM      MDM   Final diagnoses:  Atypical chest pain    Filed Vitals:   04/15/15 0515 04/15/15 0600 04/15/15 0615 04/15/15 0645  BP: 105/70 105/71 108/77 106/65  Pulse: 65 71 67 58  Temp:      TempSrc:      Resp: 18 16 9 18   SpO2: 95% 96% 98% 97%  Hannah Werner is a pleasant 54 y.o. female presenting with palpitations and centralized chest pressure onset after a nightmare midnight last night, the episode resolved spontaneously after one hour. Patient is low risk by heart score. EKG is nonischemic, troponin, chest x-ray, BNP and blood work is otherwise noncontributory. Will check delta troponin and arrange outpatient cardiology evaluation for possible stress test.  Delta troponin is negative, patient remains chest pain-free. We've had an extensive discussion of return precautions. Will start patient on daily aspirin, advise her to follow with both cardiology and her primary care physician.  Evaluation does not show pathology that would require ongoing emergent intervention or inpatient treatment. Pt is hemodynamically stable and mentating appropriately. Discussed findings and plan with patient/guardian, who agrees with care plan. All questions answered. Return precautions discussed and outpatient follow up given.   New Prescriptions   ASPIRIN 81 MG CHEWABLE TABLET    Chew 1 tablet (81 mg total) by mouth daily.        Monico Blitz, PA-C 04/15/15 [id number]  Linton Flemings, MD 04/15/15 [phone]

## 2015-04-21 ENCOUNTER — Encounter: Payer: Self-pay | Admitting: Family Medicine

## 2015-04-21 ENCOUNTER — Ambulatory Visit (INDEPENDENT_AMBULATORY_CARE_PROVIDER_SITE_OTHER): Payer: 59 | Admitting: Family Medicine

## 2015-04-21 VITALS — BP 104/64 | HR 72 | Ht 66.0 in | Wt 184.8 lb

## 2015-04-21 DIAGNOSIS — E7800 Pure hypercholesterolemia, unspecified: Secondary | ICD-10-CM

## 2015-04-21 DIAGNOSIS — F411 Generalized anxiety disorder: Secondary | ICD-10-CM

## 2015-04-21 DIAGNOSIS — G4700 Insomnia, unspecified: Secondary | ICD-10-CM | POA: Diagnosis not present

## 2015-04-21 MED ORDER — SERTRALINE HCL 100 MG PO TABS: ORAL_TABLET | ORAL | Status: DC

## 2015-04-21 NOTE — Progress Notes (Signed)
Chief Complaint  Patient presents with  . Follow-up    ER follow up.    She was seen in ER 6/16 after having chest pain starting at midnight, lasting an hour, starting immediately after waking up from a nightmare (not 30 minutes later like ER notes suggested).   Pain spontaneously resolved. ER workup was normal. Advised to f/u with PCP and cardiology for possible outpatient stress test.  She was started on 81mg  aspirin daily.  She has been feeling more anxious, some trouble sleeping.  She cut back on caffeine to just 2 in the morning in order to help with her sleep. Hot flashes are getting a little more intense, along with the anxiety that occurs just before them.   On 6/16 she woke up with a nightmare (sense of doom)--felt very funny, heart was racing, palpitations. Had trouble getting back to sleep, would feel like she was falling, wake up again with a start.  When she first woke up after the nightmare she also felt her fingers cold and tingly, nauseated.  Chest pain was described as a tightness, not sharp or intense, more like a sense of not being able to breathe. She did not take alprazolam during this episode, was worried about making something worse, thought she was having a heart attack, didn't think it was related to anxiety.  After a couple of days, the overwhelming anxiety significantly improved.  Still has sleep problems.  She gets regular exercise, and has no exertional symptoms at all (no chest pain, dyspnea, palpitations).  Family history--father had CABG at 26.  Maternal uncle had MI at 65  Hyperlipidemia:  Last cholesterol check in March was high.  She cut back on eggs from daily to just 2x/week, cut out bacon.  Having red meat just 2x/week.  Had been having more of these foods related to travel, but made significant dietary changes after getting her last lipid results back in March.  Previously used melatonin, helped a little bit with sleep. She has about 6 alprazolam tablets  left. Uses 1/2 sporadically, took one last week when she couldn't sleep, and sometimes with flights (if rough weather).  PMH, PSH, SH and FH were reviewed and updated  Outpatient Encounter Prescriptions as of 04/21/2015  Medication Sig  . ALPRAZolam (XANAX) 0.5 MG tablet Take 0.5-1 tablets (0.25-0.5 mg total) by mouth 3 (three) times daily as needed for anxiety.  . Ascorbic Acid (VITAMIN C) 1000 MG tablet Take 1,000 mg by mouth daily.  Marland Kitchen aspirin 81 MG chewable tablet Chew 1 tablet (81 mg total) by mouth daily.  . budesonide-formoterol (SYMBICORT) 160-4.5 MCG/ACT inhaler Inhale 2 puffs into the lungs 2 (two) times daily.  . fluticasone (FLONASE) 50 MCG/ACT nasal spray Place 2 sprays into both nostrils daily.  . montelukast (SINGULAIR) 10 MG tablet Take 1 tablet (10 mg total) by mouth at bedtime.  . Probiotic Product (PROBIOTIC DAILY PO) Take 1 tablet by mouth daily.   . sertraline (ZOLOFT) 100 MG tablet TAKE 1 BY MOUTH DAILY  . [DISCONTINUED] sertraline (ZOLOFT) 50 MG tablet TAKE 1 BY MOUTH DAILY  . albuterol (PROVENTIL HFA;VENTOLIN HFA) 108 (90 BASE) MCG/ACT inhaler Inhale 2 puffs into the lungs every 6 (six) hours as needed for wheezing. (Patient not taking: Reported on 12/14/2014)  . albuterol (PROVENTIL HFA;VENTOLIN HFA) 108 (90 BASE) MCG/ACT inhaler Inhale 1-2 puffs into the lungs every 6 (six) hours as needed for wheezing or shortness of breath.  . [DISCONTINUED] naproxen (NAPROSYN) 500 MG tablet Take 1  tablet (500 mg total) by mouth 2 (two) times daily with a meal.   No facility-administered encounter medications on file as of 04/21/2015.   Allergies  Allergen Reactions  . Erythromycin Nausea And Vomiting   ROS:  No fever, chills, URI/allergy symptoms.  Asthma is controlled.  No further chest pain, shortness of breath, palpitations, nausea, vomiting. No bowel changes, urinary complaints, joint pains, bleeding, bruising, rash.  No reflux since she lost weight. Denies depression.  Anxiety  and insomnia as per HPI.  PHYSICAL EXAM: BP 104/64 mmHg  Pulse 72  Ht 5\' 6"  (1.676 m)  Wt 184 lb 12.8 oz (83.825 kg)  BMI 29.84 kg/m2 Well developed, pleasant female in no distress HEENT: PERRL, EOMI, conjunctiva clear. OP clear Neck: no lymphadenopathy, thyromegaly or carotid bruit Heart: regular rate and rhythm without murmur Lungs: clear bilaterally Abdomen: soft, nontender, no organomegaly or mass Back: no CVA or spinal tenderness Extremities: no edema, 2+ pulses Psych:  Normal mood, affect, hygiene, grooming, eye contact and speech Neuro: alert and oriented.  Cranial nerve intact. Normal strength, gait  ASSESSMENT/PLAN:  Anxiety state - anxiety with travel and prior to hot flashes.  Worse since episode of CP that was likely caused by anxiety. Increase zoloft to 100mg  qHS; trial estroven for HF - Plan: sertraline (ZOLOFT) 100 MG tablet  Pure hypercholesterolemia - diet has improved.  recheck lipids - Plan: Lipid panel  Insomnia - estroven to help diminish HF and increasing zoloft should also help with her insomnia. retry melatonin   Chest pain--resolved. Likely related to anxiety. Risk factors include family history and high cholesterol. Return fasting for repeat cholesterol. Consider stress test--briefly discussed risks of false +--definitely recommend if any recurrent chest pain, and also if cholesterol remains an issue.    Anxiety and insomnia Increase the zoloft to 100mg  at bedtime. Restart melatonin at bedtime to help with sleep. Consider taking Estroven (soy and black cohosh) to help with the menopausal symptoms, which seem to contribute to your anxiety.  The hot flashes might improve just with the higher zoloft dose, but if it doesn't, estroven might help.

## 2015-04-21 NOTE — Patient Instructions (Addendum)
ncrease the zoloft to 100mg  at bedtime. Restart melatonin at bedtime to help with sleep. Consider taking Estroven (soy and black cohosh) to help with the menopausal symptoms, which seem to contribute to your anxiety.  The hot flashes might improve just with the higher zoloft dose, but if it doesn't, estroven might help.  Let me know if you have any recurrent chest pain or palpitations--we will set you up with a cardiologist for evaluation.  Return for follow-up in 4-6 weeks if your anxiety and insomnia are not improved.  Return fasting for repeat cholesterol.  Fat and Cholesterol Control Diet Fat and cholesterol levels in your blood and organs are influenced by your diet. High levels of fat and cholesterol may lead to diseases of the heart, small and large blood vessels, gallbladder, liver, and pancreas. CONTROLLING FAT AND CHOLESTEROL WITH DIET Although exercise and lifestyle factors are important, your diet is key. That is because certain foods are known to raise cholesterol and others to lower it. The goal is to balance foods for their effect on cholesterol and more importantly, to replace saturated and trans fat with other types of fat, such as monounsaturated fat, polyunsaturated fat, and omega-3 fatty acids. On average, a person should consume no more than 15 to 17 g of saturated fat daily. Saturated and trans fats are considered "bad" fats, and they will raise LDL cholesterol. Saturated fats are primarily found in animal products such as meats, butter, and cream. However, that does not mean you need to give up all your favorite foods. Today, there are good tasting, low-fat, low-cholesterol substitutes for most of the things you like to eat. Choose low-fat or nonfat alternatives. Choose round or loin cuts of red meat. These types of cuts are lowest in fat and cholesterol. Chicken (without the skin), fish, veal, and ground [place] breast are great choices. Eliminate fatty meats, such as hot dogs  and salami. Even shellfish have little or no saturated fat. Have a 3 oz (85 g) portion when you eat lean meat, poultry, or fish. Trans fats are also called "partially hydrogenated oils." They are oils that have been scientifically manipulated so that they are solid at room temperature resulting in a longer shelf life and improved taste and texture of foods in which they are added. Trans fats are found in stick margarine, some tub margarines, cookies, crackers, and baked goods.  When baking and cooking, oils are a great substitute for butter. The monounsaturated oils are especially beneficial since it is believed they lower LDL and raise HDL. The oils you should avoid entirely are saturated tropical oils, such as coconut and palm.  Remember to eat a lot from food groups that are naturally free of saturated and trans fat, including fish, fruit, vegetables, beans, grains (barley, rice, couscous, bulgur wheat), and pasta (without cream sauces).  IDENTIFYING FOODS THAT LOWER FAT AND CHOLESTEROL  Soluble fiber may lower your cholesterol. This type of fiber is found in fruits such as apples, vegetables such as broccoli, potatoes, and carrots, legumes such as beans, peas, and lentils, and grains such as barley. Foods fortified with plant sterols (phytosterol) may also lower cholesterol. You should eat at least 2 g per day of these foods for a cholesterol lowering effect.  Read package labels to identify low-saturated fats, trans fat free, and low-fat foods at the supermarket. Select cheeses that have only 2 to 3 g saturated fat per ounce. Use a heart-healthy tub margarine that is free of trans fats or partially  hydrogenated oil. When buying baked goods (cookies, crackers), avoid partially hydrogenated oils. Breads and muffins should be made from whole grains (whole-wheat or whole oat flour, instead of "flour" or "enriched flour"). Buy non-creamy canned soups with reduced salt and no added fats.  FOOD PREPARATION  TECHNIQUES  Never deep-fry. If you must fry, either stir-fry, which uses very little fat, or use non-stick cooking sprays. When possible, broil, bake, or roast meats, and steam vegetables. Instead of putting butter or margarine on vegetables, use lemon and herbs, applesauce, and cinnamon (for squash and sweet potatoes). Use nonfat yogurt, salsa, and low-fat dressings for salads.  LOW-SATURATED FAT / LOW-FAT FOOD SUBSTITUTES Meats / Saturated Fat (g)  Avoid: Steak, marbled (3 oz/85 g) / 11 g  Choose: Steak, lean (3 oz/85 g) / 4 g  Avoid: Hamburger (3 oz/85 g) / 7 g  Choose: Hamburger, lean (3 oz/85 g) / 5 g  Avoid: Ham (3 oz/85 g) / 6 g  Choose: Ham, lean cut (3 oz/85 g) / 2.4 g  Avoid: Chicken, with skin, dark meat (3 oz/85 g) / 4 g  Choose: Chicken, skin removed, dark meat (3 oz/85 g) / 2 g  Avoid: Chicken, with skin, light meat (3 oz/85 g) / 2.5 g  Choose: Chicken, skin removed, light meat (3 oz/85 g) / 1 g Dairy / Saturated Fat (g)  Avoid: Whole milk (1 cup) / 5 g  Choose: Low-fat milk, 2% (1 cup) / 3 g  Choose: Low-fat milk, 1% (1 cup) / 1.5 g  Choose: Skim milk (1 cup) / 0.3 g  Avoid: Hard cheese (1 oz/28 g) / 6 g  Choose: Skim milk cheese (1 oz/28 g) / 2 to 3 g  Avoid: Cottage cheese, 4% fat (1 cup) / 6.5 g  Choose: Low-fat cottage cheese, 1% fat (1 cup) / 1.5 g  Avoid: Ice cream (1 cup) / 9 g  Choose: Sherbet (1 cup) / 2.5 g  Choose: Nonfat frozen yogurt (1 cup) / 0.3 g  Choose: Frozen fruit bar / trace  Avoid: Whipped cream (1 tbs) / 3.5 g  Choose: Nondairy whipped topping (1 tbs) / 1 g Condiments / Saturated Fat (g)  Avoid: Mayonnaise (1 tbs) / 2 g  Choose: Low-fat mayonnaise (1 tbs) / 1 g  Avoid: Butter (1 tbs) / 7 g  Choose: Extra light margarine (1 tbs) / 1 g  Avoid: Coconut oil (1 tbs) / 11.8 g  Choose: Olive oil (1 tbs) / 1.8 g  Choose: Corn oil (1 tbs) / 1.7 g  Choose: Safflower oil (1 tbs) / 1.2 g  Choose: Sunflower oil (1 tbs) /  1.4 g  Choose: Soybean oil (1 tbs) / 2.4 g  Choose: Canola oil (1 tbs) / 1 g Document Released: 10/16/2005 Document Revised: 02/10/2013 Document Reviewed: 01/14/2014 Exit[employer] Patient Information 2015 [place], [place]. This information is not intended to replace advice given to you by your health care provider. Make sure you discuss any questions you have with your health care provider.

## 2015-04-27 ENCOUNTER — Other Ambulatory Visit: Payer: 59

## 2015-04-27 DIAGNOSIS — E7800 Pure hypercholesterolemia, unspecified: Secondary | ICD-10-CM

## 2015-04-27 LAB — LIPID PANEL
Cholesterol: 180 mg/dL (ref 0–200)
HDL: 58 mg/dL (ref >=46)
LDL CALC: 103 mg/dL — AB (ref 0–99)
TRIGLYCERIDES: 93 mg/dL (ref <150)
Total CHOL/HDL Ratio: 3.1 Ratio
VLDL: 19 mg/dL (ref 0–40)

## 2015-05-06 ENCOUNTER — Other Ambulatory Visit: Payer: Self-pay | Admitting: Family Medicine

## 2015-05-06 DIAGNOSIS — F411 Generalized anxiety disorder: Secondary | ICD-10-CM

## 2015-05-06 MED ORDER — ALPRAZOLAM 0.5 MG PO TABS: 0.2500 mg | ORAL_TABLET | Freq: Three times a day (TID) | ORAL | Status: DC | PRN

## 2015-05-06 NOTE — Addendum Note (Signed)
Addended by: Minette Headland A on: 05/06/2015 02:12 PM   Modules accepted: Orders

## 2015-05-31 DIAGNOSIS — D126 Benign neoplasm of colon, unspecified: Secondary | ICD-10-CM

## 2015-05-31 HISTORY — DX: Benign neoplasm of colon, unspecified: D12.6

## 2015-06-07 ENCOUNTER — Other Ambulatory Visit: Payer: Self-pay | Admitting: Gastroenterology

## 2015-06-07 LAB — HM COLONOSCOPY

## 2015-06-28 ENCOUNTER — Encounter: Payer: Self-pay | Admitting: *Deleted

## 2015-08-30 ENCOUNTER — Telehealth: Payer: Self-pay | Admitting: Family Medicine

## 2015-08-30 DIAGNOSIS — F411 Generalized anxiety disorder: Secondary | ICD-10-CM

## 2015-08-30 MED ORDER — ALPRAZOLAM 0.5 MG PO TABS: 0.2500 mg | ORAL_TABLET | Freq: Three times a day (TID) | ORAL | Status: DC | PRN

## 2015-08-30 NOTE — Telephone Encounter (Signed)
Did she ever get the refill for #30 that was sent in July? If she took all of those, okay for a refill of #30

## 2015-08-30 NOTE — Telephone Encounter (Signed)
Called in #30

## 2015-08-30 NOTE — Telephone Encounter (Signed)
Pt called requesting a refill for a RX of her xanax, pt is getting ready to do a lot of flying, pt uses [facility],

## 2015-12-11 IMAGING — CR DG CHEST 2V
2 series · 2 of 2 positions shown · non-contrast
Comparison: None.

CLINICAL DATA: Centralized chest pain with tingling feeling in the
tongue tonight.

EXAM:
CHEST  2 VIEW

[chest pa]
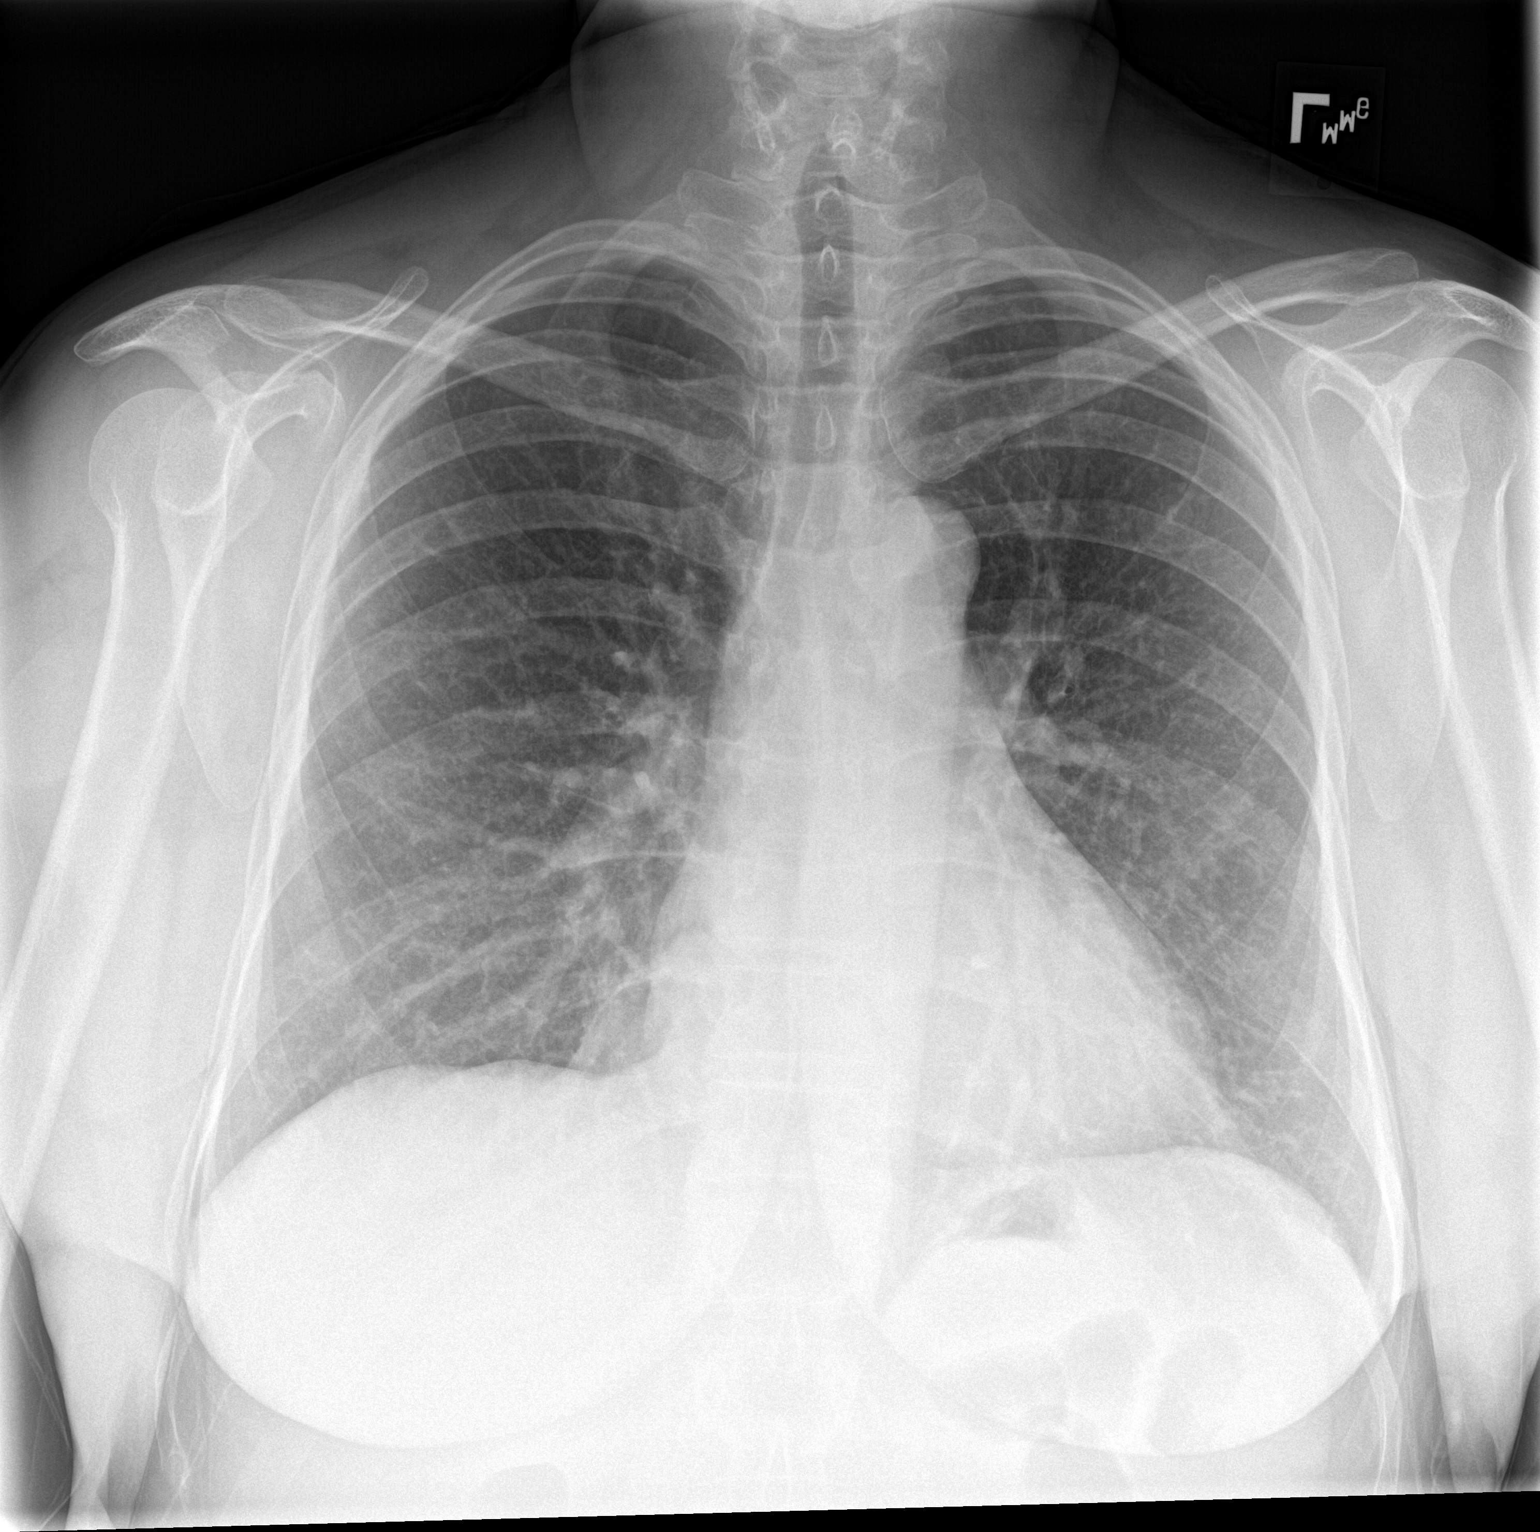

[chest lat]
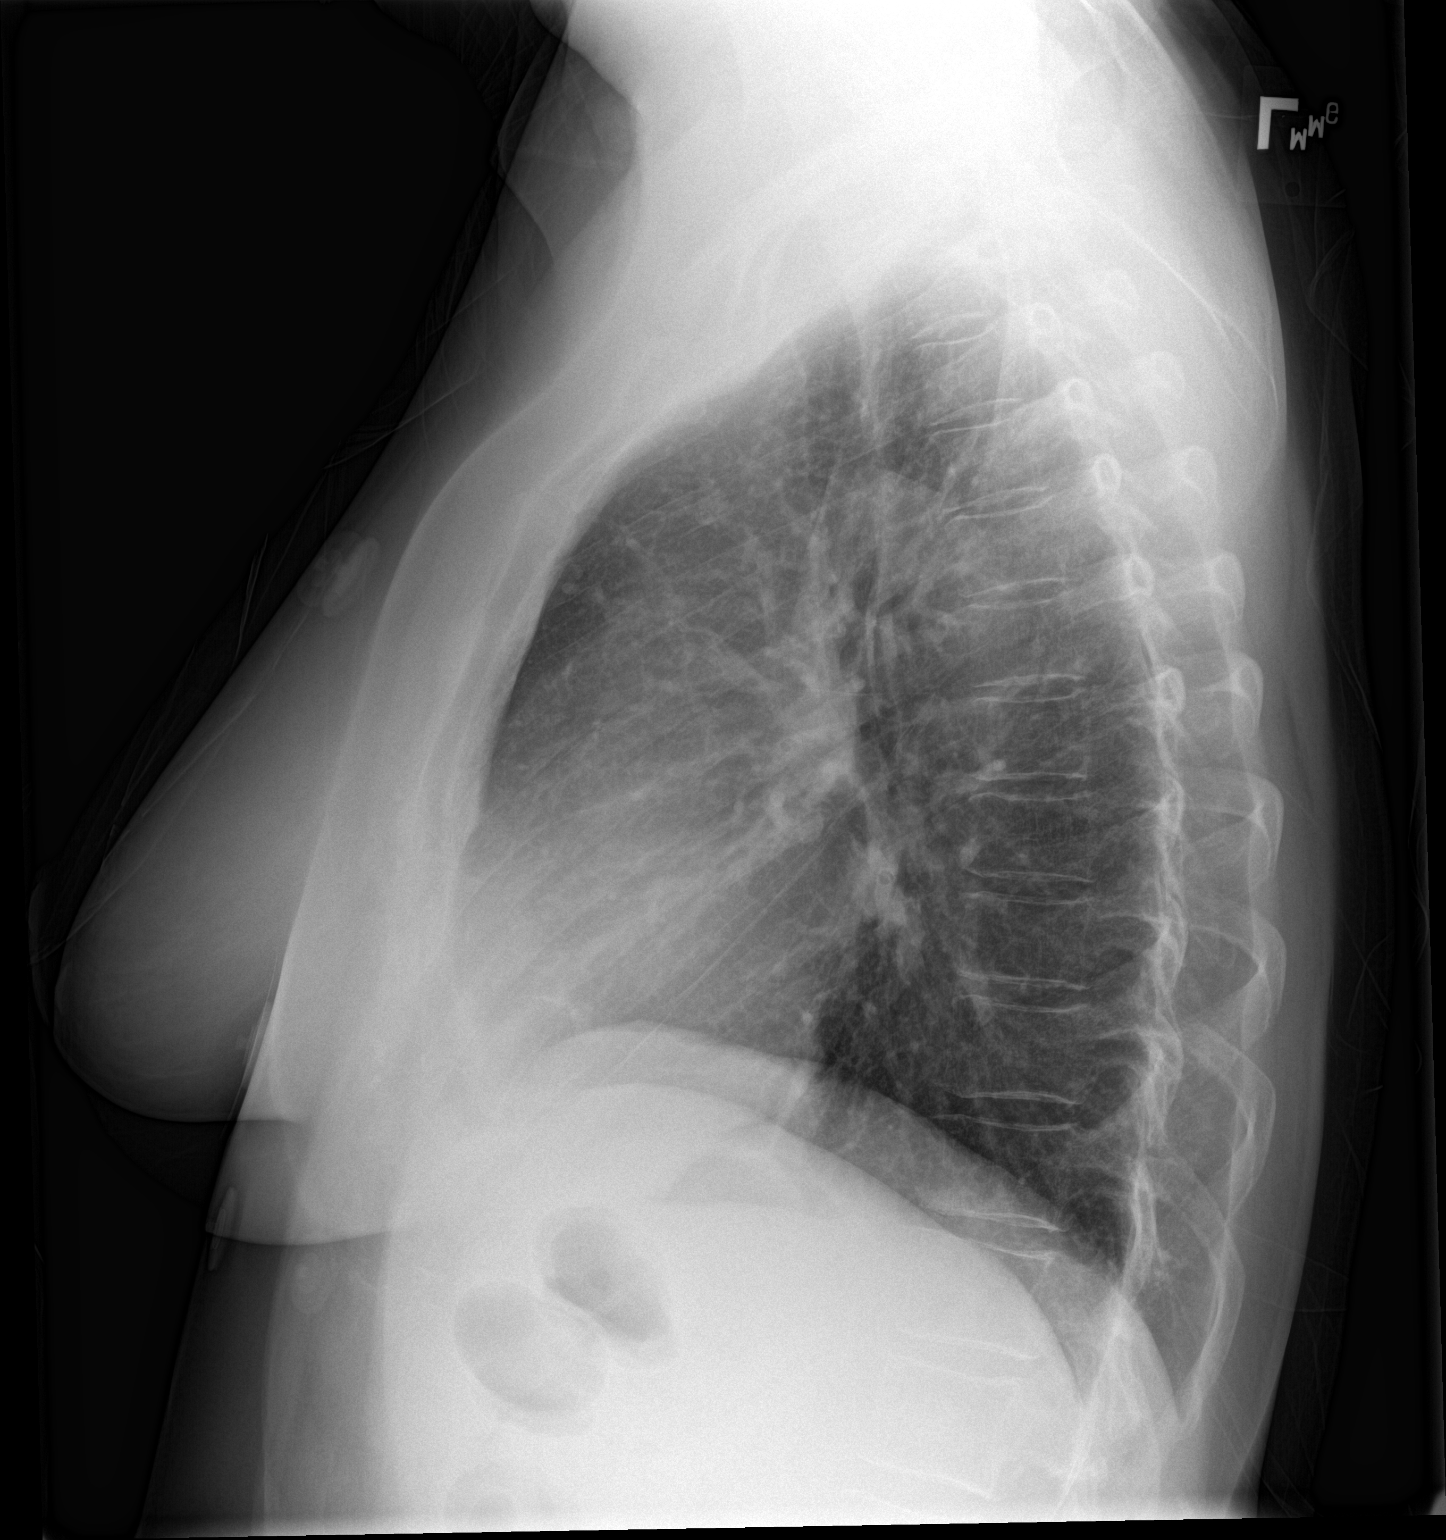

[2 of 2 positions shown; findings below may reference images not displayed]

FINDINGS: The heart size and mediastinal contours are within normal limits.
Both lungs are clear. The visualized skeletal structures are
unremarkable.
IMPRESSION: No active cardiopulmonary disease.

## 2016-01-05 ENCOUNTER — Ambulatory Visit (INDEPENDENT_AMBULATORY_CARE_PROVIDER_SITE_OTHER): Payer: 59 | Admitting: Family Medicine

## 2016-01-05 ENCOUNTER — Other Ambulatory Visit (HOSPITAL_COMMUNITY)
Admission: RE | Admit: 2016-01-05 | Discharge: 2016-01-05 | Disposition: A | Discharge status: Home / Self Care | Payer: 59 | Source: Ambulatory Visit | Attending: Family Medicine | Admitting: Family Medicine

## 2016-01-05 ENCOUNTER — Encounter: Payer: Self-pay | Admitting: Family Medicine

## 2016-01-05 VITALS — BP 112/72 | HR 64 | Ht 65.5 in | Wt 184.8 lb

## 2016-01-05 DIAGNOSIS — E782 Mixed hyperlipidemia: Secondary | ICD-10-CM

## 2016-01-05 DIAGNOSIS — J45998 Other asthma: Secondary | ICD-10-CM

## 2016-01-05 DIAGNOSIS — Z1151 Encounter for screening for human papillomavirus (HPV): Secondary | ICD-10-CM | POA: Insufficient documentation

## 2016-01-05 DIAGNOSIS — Z0000 Encounter for general adult medical examination without abnormal findings: Secondary | ICD-10-CM

## 2016-01-05 DIAGNOSIS — J309 Allergic rhinitis, unspecified: Secondary | ICD-10-CM

## 2016-01-05 DIAGNOSIS — E559 Vitamin D deficiency, unspecified: Secondary | ICD-10-CM | POA: Diagnosis not present

## 2016-01-05 DIAGNOSIS — R5383 Other fatigue: Secondary | ICD-10-CM | POA: Diagnosis not present

## 2016-01-05 DIAGNOSIS — F411 Generalized anxiety disorder: Secondary | ICD-10-CM | POA: Diagnosis not present

## 2016-01-05 DIAGNOSIS — Z01419 Encounter for gynecological examination (general) (routine) without abnormal findings: Secondary | ICD-10-CM | POA: Diagnosis present

## 2016-01-05 LAB — CBC WITH DIFFERENTIAL/PLATELET
Basophils Absolute: 0 10*3/uL (ref 0.0–0.1)
Basophils Relative: 0 % (ref 0–1)
EOS ABS: 0.2 10*3/uL (ref 0.0–0.7)
Eosinophils Relative: 3 % (ref 0–5)
HCT: 40.3 % (ref 36.0–46.0)
HEMOGLOBIN: 13.8 g/dL (ref 12.0–15.0)
Lymphocytes Relative: 26 % (ref 12–46)
Lymphs Abs: 1.5 10*3/uL (ref 0.7–4.0)
MCH: 30.9 pg (ref 26.0–34.0)
MCHC: 34.2 g/dL (ref 30.0–36.0)
MCV: 90.2 fL (ref 78.0–100.0)
MONO ABS: 0.4 10*3/uL (ref 0.1–1.0)
MONOS PCT: 7 % (ref 3–12)
MPV: 9.6 fL (ref 8.6–12.4)
Neutro Abs: 3.8 10*3/uL (ref 1.7–7.7)
Neutrophils Relative %: 64 % (ref 43–77)
PLATELETS: 279 10*3/uL (ref 150–400)
RBC: 4.47 MIL/uL (ref 3.87–5.11)
RDW: 13.1 % (ref 11.5–15.5)
WBC: 5.9 10*3/uL (ref 4.0–10.5)

## 2016-01-05 LAB — COMPREHENSIVE METABOLIC PANEL
ALBUMIN: 4.7 g/dL (ref 3.6–5.1)
ALT: 24 U/L (ref 6–29)
AST: 21 U/L (ref 10–35)
Alkaline Phosphatase: 82 U/L (ref 33–130)
BUN: 11 mg/dL (ref 7–25)
CO2: 27 mmol/L (ref 20–31)
CREATININE: 0.79 mg/dL (ref 0.50–1.05)
Calcium: 9.6 mg/dL (ref 8.6–10.4)
Chloride: 101 mmol/L (ref 98–110)
Glucose, Bld: 101 mg/dL — ABNORMAL HIGH (ref 65–99)
Potassium: 4.5 mmol/L (ref 3.5–5.3)
SODIUM: 138 mmol/L (ref 135–146)
Total Bilirubin: 0.6 mg/dL (ref 0.2–1.2)
Total Protein: 7 g/dL (ref 6.1–8.1)

## 2016-01-05 LAB — POCT URINALYSIS DIPSTICK
Bilirubin, UA: NEGATIVE
Glucose, UA: NEGATIVE
KETONES UA: NEGATIVE
Leukocytes, UA: NEGATIVE
Nitrite, UA: NEGATIVE
PH UA: 7
PROTEIN UA: NEGATIVE
RBC UA: NEGATIVE
Spec Grav, UA: 1.015
UROBILINOGEN UA: NEGATIVE

## 2016-01-05 LAB — LIPID PANEL
Cholesterol: 217 mg/dL — ABNORMAL HIGH (ref 125–200)
HDL: 68 mg/dL (ref >=46)
LDL Cholesterol: 124 mg/dL (ref <130)
Total CHOL/HDL Ratio: 3.2 Ratio (ref <=5.0)
Triglycerides: 124 mg/dL (ref <150)
VLDL: 25 mg/dL (ref <30)

## 2016-01-05 LAB — TSH: TSH: 1.41 mIU/L

## 2016-01-05 MED ORDER — BUDESONIDE-FORMOTEROL FUMARATE 160-4.5 MCG/ACT IN AERO: 2.0000 | INHALATION_SPRAY | Freq: Two times a day (BID) | RESPIRATORY_TRACT | Status: DC

## 2016-01-05 MED ORDER — MONTELUKAST SODIUM 10 MG PO TABS: 10.0000 mg | ORAL_TABLET | Freq: Every day | ORAL | Status: DC

## 2016-01-05 MED ORDER — ALPRAZOLAM 0.5 MG PO TABS: 0.2500 mg | ORAL_TABLET | Freq: Three times a day (TID) | ORAL | Status: DC | PRN

## 2016-01-05 NOTE — Patient Instructions (Addendum)
  HEALTH MAINTENANCE RECOMMENDATIONS:  It is recommended that you get at least 30 minutes of aerobic exercise at least 5 days/week (for weight loss, you may need as much as 60-90 minutes). This can be any activity that gets your heart rate up. This can be divided in 10-15 minute intervals if needed, but try and build up your endurance at least once a week.  Weight bearing exercise is also recommended twice weekly.  Eat a healthy diet with lots of vegetables, fruits and fiber.  "Colorful" foods have a lot of vitamins (ie green vegetables, tomatoes, red peppers, etc).  Limit sweet tea, regular sodas and alcoholic beverages, all of which has a lot of calories and sugar.  Up to 1 alcoholic drink daily may be beneficial for women (unless trying to lose weight, watch sugars).  Drink a lot of water.  Calcium recommendations are 1200-1500 mg daily (1500 mg for postmenopausal women or women without ovaries), and vitamin D 1000 IU daily.  This should be obtained from diet and/or supplements (vitamins), and calcium should not be taken all at once, but in divided doses.  Monthly self breast exams and yearly mammograms for women over the age of 39 is recommended.  Sunscreen of at least SPF 30 should be used on all sun-exposed parts of the skin when outside between the hours of 10 am and 4 pm (not just when at beach or pool, but even with exercise, golf, tennis, and yard work!)  Use a sunscreen that says "broad spectrum" so it covers both UVA and UVB rays, and make sure to reapply every 1-2 hours.  Remember to change the batteries in your smoke detectors when changing your clock times in the spring and fall.  Use your seat belt every time you are in a car, and please drive safely and not be distracted with cell phones and texting while driving.  Please call to schedule your yearly mammogram (last was 10/2013). Please be sure to get flu shots EVERY YEAR in the fall--if not at work, then come here or at the  pharmacy.  We will be in touch with your lab results, and to let you know if we should increase the sertraline dose, vs if your vitamin D is low, wait to see how you feel as we replace it.  Try aleve twice daily for a week to see if that helps with your foot pain It does not appear that you have a fracture or significant tendonitis based on your exam today.

## 2016-01-05 NOTE — Progress Notes (Signed)
Chief Complaint  Patient presents with  . Annual Exam    fasting annual exam with pap/med check. Just had eye exam with Dr.Jon Scott. Right foot pain x several weeks. Having some issues with her depression. Also some constipation.    Hannah Werner is a 54 y.o. female who presents for a complete physical.  She has the following concerns:  Pain in right foot for about 3 weeks, worse in the last 2 days.  Pain is on top of the foot (distal portion of 3rd through 5th metatarsal area) when she gets out of bed.  Pain/tenderness improves throughout the day.  No pain with exercise or walking. Denies any change in activity, trauma, shoes.   Menopausal symptoms:  She has some pain with intercourse, lubrication helps. Hot flashes are tapering off (significantly better), insomnia has improved, but she is now feeling "depressed".  Feels unmotivated, and unfocused.  She is able to get out of bed, going to work on time.  Denies feeling hopeless, helpless.  Mostly feels tired, "blah".  Sometimes feels a little down when she is traveling the longer trips. Denies feeling sad, crying, down, mostly just unmotivated and tired.  Anxiety: She remains on Sertraline. No longer having the anxiety due to hot flashes, as these have gotten better.  Denies side effects. Only uses xanax with travel/flying. Recently used it for a short bout of insomnia, which has resolved.  Asthma and allergies: Doing well overall. Symptoms flare when she travels to [place] (change in climate, always worse there, damp). Breathing is good--hasn't needed to use a rescue inhaler since trip to [place] in the Fall. Uses Symbicort just in the morning, and in the afternoon just prn.  High cholesterol:  Lipids were high in 12/2014 when eating a lot of bacon at work.  She improved her diet, and recheck in June 2016 was better.  She continues to follow a low cholesterol diet. Lab Results  Component Value Date   CHOL 180 04/27/2015   CHOL 245*  01/04/2015   CHOL 180 12/23/2012   Lab Results  Component Value Date   HDL 58 04/27/2015   HDL 68 01/04/2015   HDL 74 12/23/2012   Lab Results  Component Value Date   LDLCALC 103* 04/27/2015   LDLCALC 154* 01/04/2015   LDLCALC 80 12/23/2012   Lab Results  Component Value Date   TRIG 93 04/27/2015   TRIG 115 01/04/2015   TRIG 129 12/23/2012   Lab Results  Component Value Date   CHOLHDL 3.1 04/27/2015   CHOLHDL 3.6 01/04/2015   CHOLHDL 2.4 12/23/2012   No results found for: LDLDIRECT  H/o Vitamin D deficiency.  Last check 2015 was 36 when taking supplements.  She admits she hasn't taken any vitamin D or MVI in over a year.  Immunization History  Administered Date(s) Administered  . DT 04/19/2000  . Influenza Split 08/14/2011, 07/30/2014  . Influenza, Seasonal, Injecte, Preservative Fre 12/23/2012  . Influenza-Unspecified 08/01/2013  . Pneumococcal Polysaccharide-23 04/05/2011  . Tdap 04/05/2011  . Zoster 11/06/2013   Didn't get flu shot at work this year (usually gets yearly, at work) Last Pap smear: 11/2012 Last mammogram: 10/2013 Last colonoscopy: 05/2015; small tubular adenoma; repeat 5 years Last DEXA: never  Dentist: twice early  Ophtho: yearly  Exercise: walking the dogs daily, at least 30 minutes, 2-3 nights/week at the gym (weights, stretches, 20 mins elliptical)  Normal vitamin D level 12/2013, 36 (when taking supplement).  Had HIV, RPR and Hepatitis screening  done in 07/2005.  Husband has Hep C, on treatment, with negative levels per pt. Declines repeat HCV testing (done last year)  Past Medical History  Diagnosis Date  . Asthma   . Unspecified vitamin D deficiency 05/2009  . Pure hyperglyceridemia   . Anxiety   . Allergy     seasonal  . Family hx of colon cancer     mom  . Exposure to hepatitis C     husband  . Adenomatous colon polyp 05/2015    tubular adenoma (Dr. Paulita Fujita); repeat 05/2020    Past Surgical History  Procedure Laterality  Date  . Colonoscopy  08/2009, 05/2015  . Cholecystectomy  53    Social History   Social History  . Marital Status: Married    Spouse Name: N/A  . Number of Children: 0  . Years of Education: N/A   Occupational History  . [employer]name changed after [employer]) [place] [employer]  . Smoking status: Never Smoker   . Smokeless tobacco: Never Used  . Alcohol Use: 0.0 oz/week    0 Standard drinks or equivalent per week     Comment: 1-2/week  . Drug Use: No  . Sexual Activity:    Partners: Male   Other Topics Concern  . Not on file   Social History Narrative   Lives with her husband, 16 cats and 2 dogs. Husband smokes outside (recently restarted 2014).   Husband has Hepatitis C (treatment started 2016, and viral levels are zero per pt 2017)    Family History  Problem Relation Age of Onset  . Diabetes Mother   . Cancer Mother 82    colon cancer  . Colon cancer Mother 44  . Heart disease Father     CABG @69   . Hyperlipidemia Father   . Anxiety disorder Father   . Cancer Father     CLL  . Diabetes Sister   . Stroke Maternal Grandfather   . Asthma Neg Hx   . Diabetes Sister   . Seizures Sister   . Heart disease Maternal Uncle     MI at 72 or 7    Outpatient Encounter Prescriptions as of 01/05/2016  Medication Sig Note  . ALPRAZolam (XANAX) 0.5 MG tablet Take 0.5-1 tablets (0.25-0.5 mg total) by mouth 3 (three) times daily as needed for anxiety.   . Ascorbic Acid (VITAMIN C) 1000 MG tablet Take 1,000 mg by mouth daily.   Marland Kitchen aspirin 81 MG chewable tablet Chew 1 tablet (81 mg total) by mouth daily.   . budesonide-formoterol (SYMBICORT) 160-4.5 MCG/ACT inhaler Inhale 2 puffs into the lungs 2 (two) times daily.   . fluticasone (FLONASE) 50 MCG/ACT nasal spray Place 2 sprays into both nostrils daily. 01/05/2016: Uses prn, not currently  . montelukast (SINGULAIR) 10 MG tablet Take 1  tablet (10 mg total) by mouth at bedtime.   . Probiotic Product (PROBIOTIC DAILY PO) Take 1 tablet by mouth daily.    . sertraline (ZOLOFT) 100 MG tablet TAKE 1 BY MOUTH DAILY   . [DISCONTINUED] ALPRAZolam (XANAX) 0.5 MG tablet Take 0.5-1 tablets (0.25-0.5 mg total) by mouth 3 (three) times daily as needed for anxiety. 01/05/2016: Uses 1/2 tablet prn (anxiety, sometimes uses it for anxiety), 3-4 times/month  . [DISCONTINUED] budesonide-formoterol (SYMBICORT) 160-4.5 MCG/ACT inhaler Inhale 2 puffs into the lungs 2 (two) times daily. 01/05/2016: Uses 2 puffs each morning, BID  only with flares, prn  . [DISCONTINUED] montelukast (SINGULAIR) 10 MG tablet Take 1 tablet (10 mg total) by mouth at bedtime.   Marland Kitchen albuterol (PROVENTIL HFA;VENTOLIN HFA) 108 (90 BASE) MCG/ACT inhaler Inhale 1-2 puffs into the lungs every 6 (six) hours as needed for wheezing or shortness of breath. Reported on 01/05/2016 01/05/2016: Last needed in Fall  . [DISCONTINUED] albuterol (PROVENTIL HFA;VENTOLIN HFA) 108 (90 BASE) MCG/ACT inhaler Inhale 2 puffs into the lungs every 6 (six) hours as needed for wheezing. (Patient not taking: Reported on 12/14/2014)    No facility-administered encounter medications on file as of 01/05/2016.    Allergies  Allergen Reactions  . Erythromycin Nausea And Vomiting    ROS: The patient denies anorexia, fever, headaches, vision changes, decreased hearing, ear pain, sore throat, breast concerns, chest pain, dizziness, syncope, dyspnea on exertion, swelling, nausea, vomiting, diarrhea, abdominal pain, melena, hematochezia, hematuria, incontinence, dysuria, vaginal bleeding (none in 3 years, since stopping OCP's), discharge, odor or itch, genital lesions, numbness, weakness, tremor, suspicious skin lesions, depression, abnormal bleeding/bruising, or enlarged lymph nodes.  Some finger stiffness (4th and 5th fingers bilaterally), mild pain. Unchanged. Some mild constipation recently, related to travel Hot flashes  have improved. Right foot pain as per HPI. Fatigue, ?depression per HPI.  Anxiety improved. Insomnia--intermittent, doing well now.  PHYSICAL EXAM:  BP 112/72 mmHg  Pulse 64  Ht 5' 5.5" (1.664 m)  Wt 184 lb 12.8 oz (83.825 kg)  BMI 30.27 kg/m2  General Appearance:  Alert, cooperative, no distress, appears stated age   Head:  Normocephalic, without obvious abnormality, atraumatic   Eyes:  PERRL, conjunctiva/corneas clear, EOM's intact, fundi  benign   Ears:  Normal TM's and external ear canals   Nose:  Nares normal; nasal mucosa is mildly edematous; sinuses nontender  Throat:  Lips, mucosa, and tongue normal; teeth and gums normal.   Neck:  Supple, no lymphadenopathy; thyroid: no enlargement/tenderness/nodules; no carotid  bruit or JVD   Back:  Spine nontender, no curvature, ROM normal, no CVA tenderness   Lungs:  Clear to auscultation bilaterally without wheezes, rales or ronchi; respirations unlabored   Chest Wall:  No tenderness or deformity   Heart:  Regular rate and rhythm, S1 and S2 normal, no murmur, rub  or gallop   Breast Exam:  No tenderness, masses, or nipple discharge or inversion. No axillary lymphadenopathy   Abdomen:  Soft, non-tender, nondistended, normoactive bowel sounds,  no masses, no hepatosplenomegaly   Genitalia:  Normal external genitalia without lesions. BUS and vagina normal; nulliparous cervix without lesions or discharge; no cervical motion tenderness. No abnormal vaginal discharge. Uterus and adnexa not enlarged, nontender, no masses. Pap performed.   Rectal:  Normal tone, no masses or tenderness; guaiac negative stool  Extremities:  No clubbing, cyanosis or edema. right foot--no soft tissue swelling, warmth. Nontender. FROM without pain. Area of discomfort is on top of foot, at distal portions of 3rd through 5th metatarsals.  Normal exam.  Pulses:  2+ and symmetric all extremities    Skin:  Skin color, texture, turgor normal. 2 x 1.5cm oval area of mild erythema (not really clear in center, but more raised at edges) below the right breast. H/o ringworm recently treated with OTC in another location (cats)  Lymph nodes:  Cervical, supraclavicular, and axillary nodes normal   Neurologic:  CNII-XII intact, normal strength, sensation and gait; reflexes 2+ and symmetric throughout   Psych: Normal mood, affect, hygiene and grooming  Lab Results  Component Value Date   WBC 7.3 04/15/2015   HGB 13.4 04/15/2015   HCT 38.6 04/15/2015   MCV 87.9 04/15/2015   PLT 264 04/15/2015     Chemistry      Component Value Date/Time   NA 135 04/15/2015 0057   K 4.1 04/15/2015 0057   CL 100* 04/15/2015 0057   CO2 25 04/15/2015 0057   BUN 11 04/15/2015 0057   CREATININE 0.81 04/15/2015 0057   CREATININE 0.69 01/04/2015 1003      Component Value Date/Time   CALCIUM 9.3 04/15/2015 0057   ALKPHOS 88 01/04/2015 1003   AST 24 01/04/2015 1003   ALT 30 01/04/2015 1003   BILITOT 0.7 01/04/2015 1003      ASSESSMENT/PLAN:  Annual physical exam - Plan: POCT Urinalysis Dipstick, CBC with Differential/Platelet, Comprehensive metabolic panel, VITAMIN D 25 Hydroxy (Vit-D Deficiency, Fractures), TSH, Cytology - PAP [place]  Mixed hyperlipidemia - Plan: Lipid panel  Asthma in remission - Plan: montelukast (SINGULAIR) 10 MG tablet, budesonide-formoterol (SYMBICORT) 160-4.5 MCG/ACT inhaler, Spirometry with graph  Allergic rhinitis, unspecified allergic rhinitis type - Plan: montelukast (SINGULAIR) 10 MG tablet  Anxiety state - Plan: ALPRAZolam (XANAX) 0.5 MG tablet  Other fatigue - Plan: CBC with Differential/Platelet, Comprehensive metabolic panel, VITAMIN D 25 Hydroxy (Vit-D Deficiency, Fractures), TSH  Vitamin D deficiency - Plan: VITAMIN D 25 Hydroxy (Vit-D Deficiency, Fractures)   c-met, lipids, TSH  Fatigue--suspect related  to low vitamin D, vs thyroid, vs depression. If labs normal, trial of increasing sertraline to 147m (1.5 tablets).  Risks/side effects reviewed.  Waiting on lab results to determine next step.  Allergies--discussed proper use of nasal steroid sprays, and prn use of antihistamines. Asthma--doing well on symbicort 160 just once daily, using BID prn. Has updated albuterol. Discussed importance of yearly flu shots.  Asthma--well controlled on current regimen; normal spirometry.  Discussed monthly self breast exams and yearly mammograms (past due); at least 30 minutes of aerobic activity at least 5 days/week ,weight-bearing exercise 2x/wk; proper sunscreen use reviewed; healthy diet, including goals of calcium and vitamin D intake and alcohol recommendations (less than or equal to 1 drink/day) reviewed; regular seatbelt use; changing batteries in smoke detectors. Immunization recommendations discussed--strongly encouraged yearly flu shots. Colonoscopy recommendations reviewed--UTD, due again 05/2020  Foot pain--?tendonitis; trial of aleve prn.   F/u 1 year, sooner prn

## 2016-01-06 LAB — VITAMIN D 25 HYDROXY (VIT D DEFICIENCY, FRACTURES): Vit D, 25-Hydroxy: 21 ng/mL — ABNORMAL LOW (ref 30–100)

## 2016-01-07 LAB — CYTOLOGY - PAP

## 2016-02-08 ENCOUNTER — Other Ambulatory Visit: Payer: Self-pay | Admitting: Family Medicine

## 2016-02-08 DIAGNOSIS — F411 Generalized anxiety disorder: Secondary | ICD-10-CM

## 2016-02-08 MED ORDER — SERTRALINE HCL 100 MG PO TABS: ORAL_TABLET | ORAL | Status: DC

## 2016-02-08 NOTE — Telephone Encounter (Signed)
Is this okay to refill? Pt was seen last month for CPE

## 2016-02-08 NOTE — Telephone Encounter (Signed)
Pt states she has been getting 100mg  so i have sent that to pharmacy. i tried to deny the 50mg  but somehow it got sent to pharmacy so i will call and cancel the 50mg .

## 2016-02-08 NOTE — Telephone Encounter (Signed)
I almost refilled it for a year, and then realized that this is for the 50mg  dose. She may have requested wrong med.  We increased her dose to 100mg  in June 2016, and that is what we had documented that she was taking at her recent physical.  It is okay to refill med for a year, just verify which dose she actually wants, as I believe it should be 100mg . Thanks.

## 2016-06-28 ENCOUNTER — Encounter: Payer: Self-pay | Admitting: Family Medicine

## 2016-06-28 ENCOUNTER — Ambulatory Visit (INDEPENDENT_AMBULATORY_CARE_PROVIDER_SITE_OTHER): Payer: 59 | Admitting: Family Medicine

## 2016-06-28 VITALS — BP 120/76 | HR 84 | Ht 65.5 in | Wt 180.0 lb

## 2016-06-28 DIAGNOSIS — L739 Follicular disorder, unspecified: Secondary | ICD-10-CM | POA: Diagnosis not present

## 2016-06-28 DIAGNOSIS — H00013 Hordeolum externum right eye, unspecified eyelid: Secondary | ICD-10-CM | POA: Diagnosis not present

## 2016-06-28 MED ORDER — DOXYCYCLINE HYCLATE 100 MG PO TABS: 100.0000 mg | ORAL_TABLET | Freq: Two times a day (BID) | ORAL | 0 refills | Status: DC

## 2016-06-28 MED ORDER — FLUCONAZOLE 150 MG PO TABS: 150.0000 mg | ORAL_TABLET | Freq: Once | ORAL | 0 refills | Status: AC

## 2016-06-28 NOTE — Progress Notes (Signed)
Chief Complaint  Patient presents with  . Rash    in her left armpit-started about 3 weeks ago, was working in [employer].   Marland Kitchen Flu Vaccine    will get through employer.   Rash started on her left flank/lateral chest wall.  This started 3 weeks ago.  There was slight spread up to the armpit, and the rash on the chest wall completely resolved.  The rash in the armpit got worse yesterday-- It has been itching, burning like "on fire", painful. There are some tender lumps as well.  Denies change in deoderant, products, no lesions/rash on the right side.  No known tick bite fever, myalgias.  Only other issue is that she recently had 3 styes on her right eye, while in [place] last week.  She iced the swelling on the eyelid, it drained, but she still has one left.  PMH, PSH, SH reviewed  Outpatient Encounter Prescriptions as of 06/28/2016  Medication Sig Note  . Ascorbic Acid (VITAMIN C) 1000 MG tablet Take 1,000 mg by mouth daily.   Marland Kitchen aspirin 81 MG chewable tablet Chew 1 tablet (81 mg total) by mouth daily.   . budesonide-formoterol (SYMBICORT) 160-4.5 MCG/ACT inhaler Inhale 2 puffs into the lungs 2 (two) times daily.   . fluticasone (FLONASE) 50 MCG/ACT nasal spray Place 2 sprays into both nostrils daily. 01/05/2016: Uses prn, not currently  . montelukast (SINGULAIR) 10 MG tablet Take 1 tablet (10 mg total) by mouth at bedtime.   . sertraline (ZOLOFT) 100 MG tablet TAKE 1 BY MOUTH DAILY   . albuterol (PROVENTIL HFA;VENTOLIN HFA) 108 (90 BASE) MCG/ACT inhaler Inhale 1-2 puffs into the lungs every 6 (six) hours as needed for wheezing or shortness of breath. Reported on 01/05/2016 01/05/2016: Last needed in Fall  . ALPRAZolam (XANAX) 0.5 MG tablet Take 0.5-1 tablets (0.25-0.5 mg total) by mouth 3 (three) times daily as needed for anxiety. (Patient not taking: Reported on 06/28/2016)   . Probiotic Product (PROBIOTIC DAILY PO) Take 1 tablet by mouth daily.     No facility-administered encounter medications on file as of  06/28/2016.    Allergies  Allergen Reactions  . Erythromycin Nausea And Vomiting   ROS: no fever, chills, bleeding, bruising, URI symptoms, chest pain, shortness of breath, GI or GU complaints.  +stye on right upper eyelid and rash left axilla as per HPI.  PHYSICAL EXAM: BP 120/76 (BP Location: Right Arm, Patient Position: Sitting, Cuff Size: Normal)   Pulse 84   Ht 5' 5.5" (1.664 m)   Wt 180 lb (81.6 kg)   BMI 29.50 kg/m   Well appearing, pleasant female in no distress Left axilla--multiple erythematous papules, some of which form a linear pattern (coalesced).  There are no vesicles, no crusting. No pustules.  They are tender to palpation, some thickening below the skin.  Tender with deep palpation but no lymphadenopathy could be appreciated. No soft tissue swelling, streaking   R upper medial eyelid has some swelling and very minimal erythema, focally. Conjunctiva and sclera are clear.   ASSESSMENT/PLAN:  Acute folliculitis - Plan: doxycycline (VIBRA-TABS) 100 MG tablet  Stye, right    Suspected folliculitis L axilla Treat with warm compresses and doxycycline. Change out razor.  F/u if persistent/worsening.   Warm compresses to the stye on the right eye

## 2016-06-28 NOTE — Patient Instructions (Signed)
Take the antibiotic twice daily. Avoid the sun or use proper sunscreen. Apply warm compresses to the underarm area. Change to a new razor, and avoid shaving for the next few days.  Apply warm compresses to the stye on your eye as well.  Return if increasing redness, pain, fever, swelling, or other concerns develop.  Start the diflucan only if/when you develop yeast infection symptoms.  Only take the second tablet 1 week later if you have ongoing yeast symptoms.

## 2016-06-30 LAB — HM MAMMOGRAPHY

## 2016-07-07 ENCOUNTER — Other Ambulatory Visit: Payer: Self-pay | Admitting: *Deleted

## 2016-07-07 ENCOUNTER — Encounter: Payer: Self-pay | Admitting: Family Medicine

## 2016-07-07 DIAGNOSIS — F411 Generalized anxiety disorder: Secondary | ICD-10-CM

## 2016-07-07 MED ORDER — SERTRALINE HCL 100 MG PO TABS: ORAL_TABLET | ORAL | 1 refills | Status: DC

## 2016-08-02 ENCOUNTER — Encounter: Payer: Self-pay | Admitting: Family Medicine

## 2016-08-02 DIAGNOSIS — L739 Follicular disorder, unspecified: Secondary | ICD-10-CM

## 2016-08-02 MED ORDER — DOXYCYCLINE HYCLATE 100 MG PO TABS: 100.0000 mg | ORAL_TABLET | Freq: Two times a day (BID) | ORAL | 0 refills | Status: DC

## 2016-08-02 MED ORDER — FLUCONAZOLE 150 MG PO TABS: 150.0000 mg | ORAL_TABLET | Freq: Once | ORAL | 0 refills | Status: AC

## 2016-11-09 DIAGNOSIS — K8010 Calculus of gallbladder with chronic cholecystitis without obstruction: Secondary | ICD-10-CM | POA: Diagnosis not present

## 2016-12-25 DIAGNOSIS — L03011 Cellulitis of right finger: Secondary | ICD-10-CM | POA: Diagnosis not present

## 2016-12-25 DIAGNOSIS — W268XXA Contact with other sharp object(s), not elsewhere classified, initial encounter: Secondary | ICD-10-CM | POA: Diagnosis not present

## 2016-12-27 DIAGNOSIS — L03011 Cellulitis of right finger: Secondary | ICD-10-CM | POA: Diagnosis not present

## 2017-01-08 ENCOUNTER — Encounter: Payer: 59 | Admitting: Family Medicine

## 2017-01-09 NOTE — Progress Notes (Signed)
Chief Complaint  Patient presents with  . Annual Exam    fasting annual exam with pelvic. Did not do eye exam had one recently. Having more issues with constipation lately. Has noticed some hearing issues. Also pain with intercourse. Sleeping issues, getting to sleep and staying asleep. Vague feeling of feeling swimmy headed.     Hannah Werner is a 56 y.o. female who presents for a complete physical.  She has the following concerns:  Constipation has been worse in the last couple of months.  Exercise had been less related to work and travel.  She has been having some alcohol nightly, feeling more dehydrated.  She still drinks plenty of water during the day. Denies any changes to her diet.  Having stools daily, but they are harder, some straining.  May skip one day/week. She has increased her fiber intake, prunes have been helpful.  Recent UC visit for cellulitis R index finger. She completed ABX, and took diflucan yesterday for the start of a yeast infection.  Denies any current discharge or itching.  Menopausal symptoms:  She has some pain with intercourse, lubrication helps, but not as much as in the past.  Having some burning discomfort.  Interested in other treatments. Hot flashes are better. Insomnia seems to come and go.  Seems to wake up in the middle of the night and has trouble getting back to sleep. Last year she reported feeling "blah", depressed, but that has resolved.  She is taking regular vitamin D, which has helped.  She recently has been having rum and lemonade once nightly.  She had been having 2 drinks/night, cut back to just one and sleep has improved some.  Doesn't have any coffee after noon, just green tea at lunch, no caffeine later.  Anxiety: She remains on Sertraline.Anxiety is well controlled.  She ran out of xanax in November--uses if she can't fall asleep, and with travel.  She will call when she needs a refill (when she flies to [place]).  Denies side effects to  sertraline.  Asthma and allergies: Doing well overall. Symptoms flare when she travels to [place] (change in climate, always worse there, damp)--hasn't been there in a while; going to [place] in May, but this is high dessert. Breathing is good--hasn't needed to use a rescue inhaler since December, when active in the cold weather.  Uses Symbicort just in the morning, and in the afternoon just prn (not recently).  High cholesterol:  Lipids were high in 12/2014 when eating a lot of bacon at work.  She improved her diet, and rechecks have been better.  She continues to follow a low cholesterol diet. Lab Results  Component Value Date   CHOL 217 (H) 01/05/2016   HDL 68 01/05/2016   LDLCALC 124 01/05/2016   TRIG 124 01/05/2016   CHOLHDL 3.2 01/05/2016    H/o Vitamin D deficiency.  Last check last year (12/2015) and was low at 21--she hadn't been taking any MVI or Vit D supplements.  In 2015, level was 36 when taking supplements.  She is currently taking 1000 IU of Vitamin D daily.   Immunization History  Administered Date(s) Administered  . DT 04/19/2000  . Influenza Split 08/14/2011, 07/30/2014  . Influenza, Seasonal, Injecte, Preservative Fre 12/23/2012  . Influenza-Unspecified 08/01/2013  . Pneumococcal Polysaccharide-23 04/05/2011  . Tdap 04/05/2011  . Zoster 11/06/2013   Didn't get flu shot at work this year or last at work (previously got yearly, at work) Last Pap smear: 12/2015, no  high risk HPV present Last mammogram: 06/2016 Last colonoscopy: 05/2015; small tubular adenoma; repeat 5 years Last DEXA: never  Dentist: twice early  Ophtho: yearly  Exercise: walking the dogs daily, at least 30 minutes, 2-3 nights/week at the gym (weights, stretches, 20 mins elliptical) --somewhat less frequently going to the gym recently due to excess work, but is back to her usual routine now.  Had HIV, RPR and Hepatitis screening done in 07/2005.  Husband had Hep C, completed treatment and  counts have been negative.  Past Medical History:  Diagnosis Date  . Adenomatous colon polyp 05/2015   tubular adenoma (Dr. Paulita Fujita); repeat 05/2020  . Allergy    seasonal  . Anxiety   . Asthma   . Exposure to hepatitis C    husband  . Family hx of colon cancer    mom  . Pure hyperglyceridemia   . Unspecified vitamin D deficiency 05/2009    Past Surgical History:  Procedure Laterality Date  . CHOLECYSTECTOMY  84  . COLONOSCOPY  08/2009, 05/2015    Social History   Social History  . Marital status: Married    Spouse name: N/A  . Number of children: 0  . Years of education: N/A   Occupational History  . [employer]name changed after [employer]  . Smoking status: Never Smoker  . Smokeless tobacco: Never Used  . Alcohol use 0.0 oz/week     Comment: 1 nightly  . Drug use: No  . Sexual activity: Yes    Partners: Male   Other Topics Concern  . Not on file   Social History Narrative   Lives with her husband, 16 cats and 3 dogs. Husband smokes outside (recently restarted 2014).   Husband has Hepatitis C (treated in 2016; viral levels are zero per pt 2017)    Family History  Problem Relation Age of Onset  . Diabetes Mother   . Cancer Mother 40    colon cancer  . Colon cancer Mother 87  . Heart disease Father     CABG @69   . Hyperlipidemia Father   . Anxiety disorder Father   . Cancer Father     CLL  . Diabetes Sister   . Diabetes Sister   . Seizures Sister   . Heart disease Maternal Uncle     MI at 1 or 11  . Stroke Maternal Grandfather   . Asthma Neg Hx     Outpatient Encounter Prescriptions as of 01/10/2017  Medication Sig Note  . Ascorbic Acid (VITAMIN C) 1000 MG tablet Take 1,000 mg by mouth daily.   Marland Kitchen aspirin 81 MG chewable tablet Chew 1 tablet (81 mg total) by mouth daily.   . budesonide-formoterol (SYMBICORT) 160-4.5 MCG/ACT inhaler  Inhale 2 puffs into the lungs 2 (two) times daily.   . fluticasone (FLONASE) 50 MCG/ACT nasal spray Place 2 sprays into both nostrils daily. 01/05/2016: Uses prn, not currently  . montelukast (SINGULAIR) 10 MG tablet Take 1 tablet (10 mg total) by mouth at bedtime.   . sertraline (ZOLOFT) 100 MG tablet TAKE 1 BY MOUTH DAILY   . albuterol (PROVENTIL HFA;VENTOLIN HFA) 108 (90 BASE) MCG/ACT inhaler Inhale 1-2 puffs into the lungs every 6 (six) hours as needed for wheezing or shortness of breath. Reported on 01/05/2016 01/05/2016: Last needed in Fall  . ALPRAZolam (XANAX) 0.5 MG tablet Take 0.5-1 tablets (0.25-0.5 mg  total) by mouth 3 (three) times daily as needed for anxiety. (Patient not taking: Reported on 06/28/2016)   . Probiotic Product (PROBIOTIC DAILY PO) Take 1 tablet by mouth daily.    . [DISCONTINUED] doxycycline (VIBRA-TABS) 100 MG tablet Take 1 tablet (100 mg total) by mouth 2 (two) times daily.    No facility-administered encounter medications on file as of 01/10/2017.     Allergies  Allergen Reactions  . Erythromycin Nausea And Vomiting    ROS: The patient denies anorexia, fever, headaches, vision changes, decreased hearing, ear pain, sore throat, breast concerns, chest pain, dizziness, syncope, dyspnea on exertion, swelling, nausea, vomiting, diarrhea, abdominal pain, melena, hematochezia, hematuria, incontinence, dysuria, vaginal bleeding, discharge, odor or itch, genital lesions, numbness, weakness, tremor, suspicious skin lesions, depression, abnormal bleeding/bruising, or enlarged lymph nodes.  Some finger stiffness (4th and 5th fingers bilaterally), mild pain. Unchanged. Some people have told her that her hearing isn't as good. Occasional ringing. Rare hot flashes (1-2/day, mild) Some worsening constipation recently, per HPI. Anxiety is well controlled Insomnia--frequent awakening and trouble falling back to sleep as per HPI. 7-8# weight loss since last year   PHYSICAL  EXAM:   BP 110/70 (BP Location: Left Arm, Patient Position: Sitting, Cuff Size: Normal)   Pulse 80   Ht 5\' 5"  (1.651 m)   Wt 177 lb (80.3 kg)   BMI 29.45 kg/m    General Appearance:  Alert, cooperative, no distress, appears stated age   Head:  Normocephalic, without obvious abnormality, atraumatic   Eyes:  PERRL, conjunctiva/corneas clear, EOM's intact, fundi benign   Ears:  Normal TM's and external ear canals; no cerumen present  Nose:  Nares normal; nasal mucosa is mildly edematous,with clear mucus noted on the right; sinuses nontender  Throat:  Lips, mucosa, and tongue normal; teeth and gums normal.   Neck:  Supple, no lymphadenopathy; thyroid: no enlargement/tenderness/nodules; no carotid bruit or JVD   Back:  Spine nontender, no curvature, ROM normal, no CVA tenderness   Lungs:  Clear to auscultation bilaterally without wheezes, rales or ronchi; respirations unlabored   Chest Wall:  No tenderness or deformity   Heart:  Regular rate and rhythm, S1 and S2 normal, no murmur, rub or gallop   Breast Exam:  No tenderness, masses, or nipple discharge or inversion. No axillary lymphadenopathy   Abdomen:  Soft, non-tender, nondistended, normoactive bowel sounds, no masses, no hepatosplenomegaly   Genitalia:  Normal external genitalia without lesions; very mild atrophic changes (mostly posteriorly). BUS and vagina normal; no cervical motion tenderness. No abnormal vaginal discharge. Uterus and adnexa not enlarged, nontender, no masses. Pap not performed.   Rectal:  Normal tone, no masses or tenderness; guaiac negative stool  Extremities:  No clubbing, cyanosis or edema.   Pulses:  2+ and symmetric all extremities   Skin:  Skin color, texture, turgor normal.   Lymph nodes:  Cervical, supraclavicular, and axillary nodes normal   Neurologic:  CNII-XII intact, normal strength, sensation and gait; reflexes 2+ and symmetric  throughout (mildly hyper-reflexic at knees bilaterally)  Psych: Normal mood, affect, hygiene and grooming   ASSESSMENT/PLAN:  Annual physical exam - Plan: Lipid panel, Comprehensive metabolic panel, CBC with Differential/Platelet, VITAMIN D 25 Hydroxy (Vit-D Deficiency, Fractures), TSH  Asthma in remission - normal spirometry.  ok to continue once daily Symbicort, increase to BID prn - Plan: budesonide-formoterol (SYMBICORT) 160-4.5 MCG/ACT inhaler, montelukast (SINGULAIR) 10 MG tablet, Spirometry with Graph  Vitamin D deficiency - due for recheck; compliant with supplement -  Plan: VITAMIN D 25 Hydroxy (Vit-D Deficiency, Fractures)  Mixed hyperlipidemia - Plan: Lipid panel  Anxiety state - Plan: sertraline (ZOLOFT) 100 MG tablet  Constipation, unspecified constipation type - counseled re: diet, exercise, OTC meds (stool softeners vs Miralax prn) - Plan: TSH  Insomnia, unspecified type - suspect nightly alcohol contriuting to interrupted sleep--cut back  Anxiety state - anxiety with travel and prior to hot flashes.  Worse since episode of CP that was likely caused by anxiety. Increase zoloft to 100mg  qHS; trial estroven for HF - Plan: sertraline (ZOLOFT) 100 MG tablet  Postmenopausal atrophic vaginitis - trial of topical estrogen cream.  risks/side effects reviewed - Plan: conjugated estrogens (PREMARIN) vaginal cream  Spirometry--normal shingrix recommended--check with insurance  Strongly encouraged yearly flu shots (declined today) due to her asthma.  Trial of premarin cream since lubricants are ineffective in treating pain with intercourse due to menopausal changes.  Hearing eval suggested (normal exam, no cerumen).   Discussed monthly self breast exams and yearly mammograms; at least 30 minutes of aerobic activity at least 5 days/week ,weight-bearing exercise 2x/wk; proper sunscreen use reviewed; healthy diet, including goals of calcium and vitamin D  intake and alcohol recommendations (less than or equal to 1 drink/day) reviewed; regular seatbelt use; changing batteries in smoke detectors. Immunization recommendations discussed--strongly encouraged yearly flu shots. Shingrix recommended. Colonoscopy recommendations reviewed--UTD, due again 05/2020  Total visit time 50 minutes (including physical, and med check, with significant counseling as per notes)  F/u 1 year, sooner prn.

## 2017-01-10 ENCOUNTER — Encounter: Payer: Self-pay | Admitting: Family Medicine

## 2017-01-10 ENCOUNTER — Ambulatory Visit (INDEPENDENT_AMBULATORY_CARE_PROVIDER_SITE_OTHER): Payer: 59 | Admitting: Family Medicine

## 2017-01-10 VITALS — BP 110/70 | HR 80 | Ht 65.0 in | Wt 177.0 lb

## 2017-01-10 DIAGNOSIS — K5900 Constipation, unspecified: Secondary | ICD-10-CM | POA: Diagnosis not present

## 2017-01-10 DIAGNOSIS — E559 Vitamin D deficiency, unspecified: Secondary | ICD-10-CM

## 2017-01-10 DIAGNOSIS — Z0000 Encounter for general adult medical examination without abnormal findings: Secondary | ICD-10-CM

## 2017-01-10 DIAGNOSIS — J45998 Other asthma: Secondary | ICD-10-CM | POA: Diagnosis not present

## 2017-01-10 DIAGNOSIS — G4700 Insomnia, unspecified: Secondary | ICD-10-CM

## 2017-01-10 DIAGNOSIS — E782 Mixed hyperlipidemia: Secondary | ICD-10-CM

## 2017-01-10 DIAGNOSIS — N952 Postmenopausal atrophic vaginitis: Secondary | ICD-10-CM

## 2017-01-10 DIAGNOSIS — F411 Generalized anxiety disorder: Secondary | ICD-10-CM

## 2017-01-10 LAB — CBC WITH DIFFERENTIAL/PLATELET
BASOS PCT: 1 %
Basophils Absolute: 55 cells/uL (ref 0–200)
EOS ABS: 275 {cells}/uL (ref 15–500)
Eosinophils Relative: 5 %
HEMATOCRIT: 43.1 % (ref 35.0–45.0)
Hemoglobin: 14.5 g/dL (ref 11.7–15.5)
Lymphocytes Relative: 27 %
Lymphs Abs: 1485 cells/uL (ref 850–3900)
MCH: 30.2 pg (ref 27.0–33.0)
MCHC: 33.6 g/dL (ref 32.0–36.0)
MCV: 89.8 fL (ref 80.0–100.0)
MONO ABS: 385 {cells}/uL (ref 200–950)
MONOS PCT: 7 %
MPV: 10.5 fL (ref 7.5–12.5)
NEUTROS PCT: 60 %
Neutro Abs: 3300 cells/uL (ref 1500–7800)
PLATELETS: 265 10*3/uL (ref 140–400)
RBC: 4.8 MIL/uL (ref 3.80–5.10)
RDW: 13 % (ref 11.0–15.0)
WBC: 5.5 10*3/uL (ref 4.0–10.5)

## 2017-01-10 LAB — LIPID PANEL
Cholesterol: 211 mg/dL — ABNORMAL HIGH (ref <200)
HDL: 66 mg/dL (ref >50)
LDL CALC: 117 mg/dL — AB (ref <100)
Total CHOL/HDL Ratio: 3.2 Ratio (ref <5.0)
Triglycerides: 141 mg/dL (ref <150)
VLDL: 28 mg/dL (ref <30)

## 2017-01-10 LAB — COMPREHENSIVE METABOLIC PANEL
ALBUMIN: 4.7 g/dL (ref 3.6–5.1)
ALT: 22 U/L (ref 6–29)
AST: 18 U/L (ref 10–35)
Alkaline Phosphatase: 75 U/L (ref 33–130)
BILIRUBIN TOTAL: 0.6 mg/dL (ref 0.2–1.2)
BUN: 13 mg/dL (ref 7–25)
CHLORIDE: 104 mmol/L (ref 98–110)
CO2: 26 mmol/L (ref 20–31)
CREATININE: 0.73 mg/dL (ref 0.50–1.05)
Calcium: 9.7 mg/dL (ref 8.6–10.4)
Glucose, Bld: 94 mg/dL (ref 65–99)
Potassium: 4.4 mmol/L (ref 3.5–5.3)
Sodium: 137 mmol/L (ref 135–146)
TOTAL PROTEIN: 7.1 g/dL (ref 6.1–8.1)

## 2017-01-10 LAB — TSH: TSH: 1.31 mIU/L

## 2017-01-10 MED ORDER — ESTROGENS, CONJUGATED 0.625 MG/GM VA CREA
TOPICAL_CREAM | VAGINAL | 12 refills | Status: DC
Start: 2017-01-10 — End: 2018-06-06

## 2017-01-10 MED ORDER — BUDESONIDE-FORMOTEROL FUMARATE 160-4.5 MCG/ACT IN AERO: 2.0000 | INHALATION_SPRAY | Freq: Two times a day (BID) | RESPIRATORY_TRACT | 3 refills | Status: DC

## 2017-01-10 MED ORDER — MONTELUKAST SODIUM 10 MG PO TABS: 10.0000 mg | ORAL_TABLET | Freq: Every day | ORAL | 3 refills | Status: DC

## 2017-01-10 MED ORDER — SERTRALINE HCL 100 MG PO TABS: ORAL_TABLET | ORAL | 3 refills | Status: DC

## 2017-01-10 NOTE — Patient Instructions (Addendum)
HEALTH MAINTENANCE RECOMMENDATIONS:  It is recommended that you get at least 30 minutes of aerobic exercise at least 5 days/week (for weight loss, you may need as much as 60-90 minutes). This can be any activity that gets your heart rate up. This can be divided in 10-15 minute intervals if needed, but try and build up your endurance at least once a week.  Weight bearing exercise is also recommended twice weekly.  Eat a healthy diet with lots of vegetables, fruits and fiber.  "Colorful" foods have a lot of vitamins (ie green vegetables, tomatoes, red peppers, etc).  Limit sweet tea, regular sodas and alcoholic beverages, all of which has a lot of calories and sugar.  Up to 1 alcoholic drink daily may be beneficial for women (unless trying to lose weight, watch sugars).  Drink a lot of water.  Calcium recommendations are 1200-1500 mg daily (1500 mg for postmenopausal women or women without ovaries), and vitamin D 1000 IU daily.  This should be obtained from diet and/or supplements (vitamins), and calcium should not be taken all at once, but in divided doses.  Monthly self breast exams and yearly mammograms for women over the age of 62 is recommended.  Sunscreen of at least SPF 30 should be used on all sun-exposed parts of the skin when outside between the hours of 10 am and 4 pm (not just when at beach or pool, but even with exercise, golf, tennis, and yard work!)  Use a sunscreen that says "broad spectrum" so it covers both UVA and UVB rays, and make sure to reapply every 1-2 hours.  Remember to change the batteries in your smoke detectors when changing your clock times in the spring and fall.  Use your seat belt every time you are in a car, and please drive safely and not be distracted with cell phones and texting while driving.  Try using the premarin cream 2-3 times/week to see if this helps with your vaginal dryness and discomfort.    I recommend getting the new shingles vaccine (Shingrix)  when available. You will need to check with your insurance to see if it is covered.  It is a series of 2 injections, spaced 2 months apart.  I suspect that your increased alcohol intake is affecting your sleep.  Try having alcohol just on the weekends. You can consider adding a stool softener such as colace to your daily routine (and back off if stools are too loose/frequent).  Increasing fiber and water should help.  It may be worse related to your decrease in exercise and traveling.    Constipation, Adult Constipation is when a person has fewer bowel movements in a week than normal, has difficulty having a bowel movement, or has stools that are dry, hard, or larger than normal. Constipation may be caused by an underlying condition. It may become worse with age if a person takes certain medicines and does not take in enough fluids. Follow these instructions at home: Eating and drinking    Eat foods that have a lot of fiber, such as fresh fruits and vegetables, whole grains, and beans.  Limit foods that are high in fat, low in fiber, or overly processed, such as french fries, hamburgers, cookies, candies, and soda.  Drink enough fluid to keep your urine clear or pale yellow. General instructions   Exercise regularly or as told by your health care provider.  Go to the restroom when you have the urge to go. Do not hold it  in.  Take over-the-counter and prescription medicines only as told by your health care provider. These include any fiber supplements.  Practice pelvic floor retraining exercises, such as deep breathing while relaxing the lower abdomen and pelvic floor relaxation during bowel movements.  Watch your condition for any changes.  Keep all follow-up visits as told by your health care provider. This is important. Contact a health care provider if:  You have pain that gets worse.  You have a fever.  You do not have a bowel movement after 4 days.  You vomit.  You are  not hungry.  You lose weight.  You are bleeding from the anus.  You have thin, pencil-like stools. Get help right away if:  You have a fever and your symptoms suddenly get worse.  You leak stool or have blood in your stool.  Your abdomen is bloated.  You have severe pain in your abdomen.  You feel dizzy or you faint. This information is not intended to replace advice given to you by your health care provider. Make sure you discuss any questions you have with your health care provider. Document Released: 07/14/2004 Document Revised: 05/05/2016 Document Reviewed: 04/05/2016 Elsevier Interactive Patient Education  2017 [employer].

## 2017-01-11 LAB — VITAMIN D 25 HYDROXY (VIT D DEFICIENCY, FRACTURES): Vit D, 25-Hydroxy: 32 ng/mL (ref 30–100)

## 2017-07-04 DIAGNOSIS — Z1231 Encounter for screening mammogram for malignant neoplasm of breast: Secondary | ICD-10-CM | POA: Diagnosis not present

## 2017-07-04 LAB — HM MAMMOGRAPHY

## 2017-07-25 ENCOUNTER — Encounter: Payer: 59 | Admitting: Family Medicine

## 2017-09-26 ENCOUNTER — Encounter: Payer: Self-pay | Admitting: Family Medicine

## 2017-10-01 ENCOUNTER — Encounter: Payer: Self-pay | Admitting: Family Medicine

## 2017-10-01 ENCOUNTER — Ambulatory Visit (INDEPENDENT_AMBULATORY_CARE_PROVIDER_SITE_OTHER): Payer: 59 | Admitting: Family Medicine

## 2017-10-01 VITALS — BP 130/70 | HR 80 | Ht 65.0 in | Wt 182.0 lb

## 2017-10-01 DIAGNOSIS — Z86018 Personal history of other benign neoplasm: Secondary | ICD-10-CM

## 2017-10-01 NOTE — Patient Instructions (Addendum)
You took a photo of the mole on your left forearm. Check this once a month for any change. If there is any change in size, color, shape, or any itching, burning, bleeding or other concerns, please return for excision.  It is probably a good idea to establish with a new dermatologist (since you no longer have one).  They likely can't get you in anytime very soon    Melanoma Melanoma is a form of skin cancer that begins in melanocytes. Melanocytes are the skin cells that produce pigment. Melanoma starts as a mole on the skin and can spread to other parts of the body. If found early, many cases of melanoma are curable. What are the causes? The exact cause is unknown. What increases the risk?  Spending a lot of time in the sun, under a sunlamp, or in a tanning booth.  Having sunburn that blisters. The more blistering sunburns you have, the higher your risk of melanoma.  Spending time in parts of the world with more intense sunlight.  Living in a hot, sunny climate.  Having fair skin that does not tan easily.  Having had melanoma before.  Having a family history of melanoma.  Having more than 100 skin moles. What are the signs or symptoms?  ABCDE changes in a mole. ABCDE stands for: ? Asymmetry. This means the mole has an irregular shape. It is not round or oval. ? Border. This means the mole has an irregular or bumpy border. ? Color. This means the mole has multiple colors in it, including brown, black, blue, red, or tan. ? Diameter. This means the mole is more than 0.2 in (6 mm) across. ? Evolving. This refers to any unusual changes or symptoms in the mole, such as pain, itching, stinging, sensitivity, or bleeding.  A new mole.  Swollen lymph nodes.  Shortness of breath.  Bone pain.  Headache.  Seizures.  Visual problems. How is this diagnosed? Your health care provider will take a tissue sample from the mole to examine under a microscope (biopsy). The biopsy will  reveal whether melanoma has spread to deeper layers of the skin. Your health care provider will also order tests, including:  Blood tests.  Chest X-rays.  A CT scan.  A bone scan.  How is this treated? You will have surgery to remove the cancer. Your lymph nodes may also be removed during the surgery. If melanoma has spread to other organs, such as the liver, lungs, bone, or brain, you will need additional treatment. How is this prevented? Melanoma may come back (recur) after it has been treated. Your risk of recurrence is higher if you had thick or ulcerated tumors or patches of tumors. Generally, the more advanced your melanoma, the more likely it will recur. You can help prevent melanoma from recurring by staying out of the sun, especially during peak midafternoon hours. When you are outdoors or in the sun:  Wear long sleeves, a hat, and sunglasses that block UV light when possible.  Apply a sunscreen with an SPF of 30 or higher regularly.  Take these precautions on cloudy days and in the winter, even if you will be outdoors for only a short period of time. Contact a health care provider if:  You notice any new growths or changes in your skin.  You have had melanoma removed and you notice a new growth in the same location. This information is not intended to replace advice given to you by your health care  provider. Make sure you discuss any questions you have with your health care provider. Document Released: 10/16/2005 Document Revised: 03/23/2016 Document Reviewed: 12/10/2013 Elsevier Interactive Patient Education  2017 [employer].

## 2017-10-01 NOTE — Progress Notes (Signed)
Chief Complaint  Patient presents with  . Nevus    on left forearm that has changed.    She has had a "sun spot" on her left forearm for a while. She has been aware of it changing some--being a little raised, possibly slightly larger, in the last few weeks.  No known injury or trauma.  No bleeding, crusting, not painful or itchy.  She recently had a friend who passed away related to complications from a skin cancer, so wanted to get this checked out.  She previously had a dermatologist, who now only does research.  Denies h/o prior skin cancers  PMH, PSH, SH reviewed  Outpatient Encounter Medications as of 10/01/2017  Medication Sig Note  . Ascorbic Acid (VITAMIN C) 1000 MG tablet Take 1,000 mg by mouth daily.   Marland Kitchen aspirin 81 MG chewable tablet Chew 1 tablet (81 mg total) by mouth daily.   . budesonide-formoterol (SYMBICORT) 160-4.5 MCG/ACT inhaler Inhale 2 puffs into the lungs 2 (two) times daily.   Marland Kitchen conjugated estrogens (PREMARIN) vaginal cream Apply one applicator intravaginally 2-3 times/week as needed for vaginal dryness   . montelukast (SINGULAIR) 10 MG tablet Take 1 tablet (10 mg total) by mouth at bedtime.   . Probiotic Product (PROBIOTIC DAILY PO) Take 1 tablet by mouth daily.    . sertraline (ZOLOFT) 100 MG tablet TAKE 1 BY MOUTH DAILY   . albuterol (PROVENTIL HFA;VENTOLIN HFA) 108 (90 BASE) MCG/ACT inhaler Inhale 1-2 puffs into the lungs every 6 (six) hours as needed for wheezing or shortness of breath. Reported on 01/05/2016 01/05/2016: Last needed in Fall  . ALPRAZolam (XANAX) 0.5 MG tablet Take 0.5-1 tablets (0.25-0.5 mg total) by mouth 3 (three) times daily as needed for anxiety. (Patient not taking: Reported on 06/28/2016)   . fluticasone (FLONASE) 50 MCG/ACT nasal spray Place 2 sprays into both nostrils daily. (Patient not taking: Reported on 10/01/2017) 01/05/2016: Uses prn, not currently   No facility-administered encounter medications on file as of 10/01/2017.    Allergies   Allergen Reactions  . Erythromycin Nausea And Vomiting   ROS:  No other skin concerns, bleeding, bruising, rashing, itching.  Denies other concerns--no fever, URI symptoms, chest pain.  Got congestion (nasal) while in the waiting room, otherwise no problems  PHYSICAL EXAM:  BP 130/70   Pulse 80   Ht 5\' 5"  (1.651 m)   Wt 182 lb (82.6 kg)   BMI 30.29 kg/m   Well appearing, pleasant female, in good spirits  Right lateral forearm 36mm x 31mm light brown lesion, mostly flat. Fairly uniform in color, light brown Somewhat slightly heart-shaped (with the top of the heart being on the lateral side), with the right side of the "heart" having an area that is more raised, about 4x39mm in size. The raised part is slightly darker, and somewhat rough feeling.  ASSESSMENT/PLAN:  History of changing skin mole   Suspect benign lesion--area of change almost seems like an overlying SK developing., Picture taken on pt's phone.  Will observe and return for excision if any changes in size/shape/color. Encouraged to establish care with dermatologist at some point.  Counseled re: ABCDE's of melanoma

## 2017-11-27 DIAGNOSIS — Z719 Counseling, unspecified: Secondary | ICD-10-CM | POA: Diagnosis not present

## 2017-12-12 DIAGNOSIS — Z719 Counseling, unspecified: Secondary | ICD-10-CM | POA: Diagnosis not present

## 2017-12-19 DIAGNOSIS — Z719 Counseling, unspecified: Secondary | ICD-10-CM | POA: Diagnosis not present

## 2017-12-26 DIAGNOSIS — Z719 Counseling, unspecified: Secondary | ICD-10-CM | POA: Diagnosis not present

## 2018-01-02 DIAGNOSIS — Z719 Counseling, unspecified: Secondary | ICD-10-CM | POA: Diagnosis not present

## 2018-01-15 NOTE — Progress Notes (Signed)
Chief Complaint  Patient presents with  . Annual Exam    nonfasting (patient forgot) annual exam with pelvic. Pain in heel, mostly right heel-intense sharp pain. Appetite has increased, but feels nauseous and like she pass out about an hour after she eats.     Hannah Werner is a 57 y.o. female who presents for a complete physical.  She has the following concerns:  Right heel pain in the last couple of months, when she first wakes up in the morning, or sitting prolonged periods at work.  Increased appetite, nauseated and dizzy an hour after eating. This only recently started, since she started cutting back on her calories. Today only had stomach growling after eating, not the nausea/dizziness (very slight).   Mole R forearm--denies change in size or color (took picture on her phone at visit in December)--it is no longer raised.  Menopausal symptoms: Last year she complained of pain with intercourse, despite use of lubrication.  She was having some burning discomfort and was given trial of premarin vaginal cream. It was helpful, but not using recently due to husband's issues. Denies any significant hot flashes (rare, mild).  Insomnia:  Last year reported waking up in the middle of the night, had trouble getting back to sleep.  It was worse when having 2 alcoholic drinks/night, improved when she cut back to one (rum and lemonade). She no longer is drinking any alcohol, and is definitely sleeping better now.  Hasn't had anything to drink in 2 months.  Anxiety: She remains on Sertraline.Anxiety is well controlled.Denies side effects to sertraline. Alprazolam was last refilled 12/2015. Not interested in changing dose/tapering.  Asthma and allergies: Doing well overall.Breathing is good--hasn't needed to use a rescue inhaler since the Fall, she can't even remember the last time.  Uses Symbicort just in the morning, and in the afternoon just prn (not recently). Takes montelukast at  night.  High cholesterol: Lipids were high in 12/2014 when eating a lot of bacon at work. She improved her diet, and rechecks have been better. She continues to follow a low cholesterol diet. Due for recheck. Lab Results  Component Value Date   CHOL 211 (H) 01/10/2017   HDL 66 01/10/2017   LDLCALC 117 (H) 01/10/2017   TRIG 141 01/10/2017   CHOLHDL 3.2 01/10/2017    H/o Vitamin D deficiency. Last check was 32 last year.  In 12/2015 it was low at 21--she hadn't been taking any MVI or Vit D supplements.She continues to take 1000 IU of Vitamin D daily.   Immunization History  Administered Date(s) Administered  . DT 04/19/2000  . Influenza Split 08/14/2011, 07/30/2014  . Influenza, Seasonal, Injecte, Preservative Fre 12/23/2012  . Influenza-Unspecified 08/01/2013  . Pneumococcal Polysaccharide-23 04/05/2011  . Tdap 04/05/2011  . Zoster 11/06/2013   Flu shot declined Last Pap smear: 12/2015, no high risk HPV present Last mammogram: 06/2017 Last colonoscopy: 05/2015; small tubular adenoma; repeat 5 years Last DEXA: never  Dentist: twice early  Ophtho: yearly  Exercise: gym or long walks with the dog 3x/week. At the gym does a 30 min interval work-out once a week, 1 day abs/stretching, weights (light/toning).  Had HIV, RPR and Hepatitis screening done in 07/2005.  Husband had Hep C, completed treatment and counts have been negative.   Past Medical History:  Diagnosis Date  . Adenomatous colon polyp 05/2015   tubular adenoma (Dr. Paulita Fujita); repeat 05/2020  . Allergy    seasonal  . Anxiety   . Asthma   .  Exposure to hepatitis C    husband  . Family hx of colon cancer    mom  . Pure hyperglyceridemia   . Unspecified vitamin D deficiency 05/2009    Past Surgical History:  Procedure Laterality Date  . CHOLECYSTECTOMY  84  . COLONOSCOPY  08/2009, 05/2015    Social History   Socioeconomic History  . Marital status: Married    Spouse name: Not on file  . Number  of children: 0  . Years of education: Not on file  . Highest education level: Not on file  Occupational History  . Occupation: Biomedical engineer: [employer]  . 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 Financial resource strain: Not on file  . Food insecurity:    Worry: Not on file    Inability: Not on file  . Transportation needs:    Medical: Not on file    Non-medical: Not on file  Tobacco Use  . Smoking status: Never Smoker  . Smokeless tobacco: Never Used  Substance and Sexual Activity  . Alcohol use: Yes    Alcohol/week: 0.0 oz    Comment: 1 nightly  . Drug use: No  . Sexual activity: Yes    Partners: Male  Lifestyle  . Physical activity:    Days per week: Not on file    Minutes per session: Not on file  . Stress: Not on file  Relationships  . Social connections:    Talks on phone: Not on file    Gets together: Not on file    Attends religious service: Not on file    Active member of club or organization: Not on file    Attends meetings of clubs or organizations: Not on file    Relationship status: Not on file  . Intimate partner violence:    Fear of current or ex partner: Not on file    Emotionally abused: Not on file    Physically abused: Not on file    Forced sexual activity: Not on file  Other Topics Concern  . Not on file  Social History Narrative   Lives with her husband, 16 cats and 3 dogs. Husband smokes outside (recently restarted 2014).   Husband has Hepatitis C (treated in 2016; viral levels are zero per pt 2017)    Family History  Problem Relation Age of Onset  . Diabetes Mother   . Cancer Mother 48       colon cancer  . Colon cancer Mother 56  . Heart disease Father        CABG @69   . Hyperlipidemia Father   . Anxiety disorder Father   . Cancer Father        CLL  . Diabetes Sister   . Diabetes Sister   . Seizures Sister   . Heart  disease Maternal Uncle        MI at 85 or 54  . Stroke Maternal Grandfather   . Asthma Neg Hx     Outpatient Encounter Medications as of 01/17/2018  Medication Sig Note  . Ascorbic Acid (VITAMIN C) 1000 MG tablet Take 1,000 mg by mouth daily.   Marland Kitchen aspirin 81 MG chewable tablet Chew 1 tablet (81 mg total) by mouth daily.   . budesonide-formoterol (SYMBICORT) 160-4.5 MCG/ACT inhaler Inhale 2 puffs into the lungs 2 (two) times daily.   Marland Kitchen conjugated estrogens (PREMARIN) vaginal  cream Apply one applicator intravaginally 2-3 times/week as needed for vaginal dryness   . montelukast (SINGULAIR) 10 MG tablet Take 1 tablet (10 mg total) by mouth at bedtime.   . sertraline (ZOLOFT) 100 MG tablet TAKE 1 BY MOUTH DAILY   . [DISCONTINUED] budesonide-formoterol (SYMBICORT) 160-4.5 MCG/ACT inhaler Inhale 2 puffs into the lungs 2 (two) times daily.   . [DISCONTINUED] montelukast (SINGULAIR) 10 MG tablet Take 1 tablet (10 mg total) by mouth at bedtime.   . [DISCONTINUED] sertraline (ZOLOFT) 100 MG tablet TAKE 1 BY MOUTH DAILY   . albuterol (PROVENTIL HFA;VENTOLIN HFA) 108 (90 BASE) MCG/ACT inhaler Inhale 1-2 puffs into the lungs every 6 (six) hours as needed for wheezing or shortness of breath. Reported on 01/05/2016 01/05/2016: Last needed in Fall  . ALPRAZolam (XANAX) 0.5 MG tablet Take 0.5-1 tablets (0.25-0.5 mg total) by mouth 3 (three) times daily as needed for anxiety. (Patient not taking: Reported on 06/28/2016)   . fluticasone (FLONASE) 50 MCG/ACT nasal spray Place 2 sprays into both nostrils daily. (Patient not taking: Reported on 10/01/2017) 01/05/2016: Uses prn, not currently  . [DISCONTINUED] Probiotic Product (PROBIOTIC DAILY PO) Take 1 tablet by mouth daily.     No facility-administered encounter medications on file as of 01/17/2018.     Allergies  Allergen Reactions  . Erythromycin Nausea And Vomiting    ROS: The patient denies anorexia, fever, headaches, vision changes, decreased hearing, ear pain,  sore throat, breast concerns, chest pain, dizziness, syncope, dyspnea on exertion, swelling, nausea, vomiting, diarrhea, abdominal pain, melena, hematochezia, hematuria, incontinence, dysuria, vaginal bleeding, discharge, odor or itch, genital lesions, numbness, weakness, tremor, suspicious skin lesions, depression, abnormal bleeding/bruising, or enlarged lymph nodes.  Some finger stiffness (4th and 5th fingers bilaterally), mild pain. Unchanged. Some people have told her that her hearing isn't as good. Occasional ringing. Rare hot flashes (1/day, mild) Anxiety is well controlled. No longer having issues with constipation or insomnia. +hunger, dizziness and heel pain as per HPI Heel pain per HPI.   PHYSICAL EXAM:  BP 130/86   Pulse 80   Ht 5' 5"  (1.651 m)   Wt 179 lb 12.8 oz (81.6 kg)   BMI 29.92 kg/m   Wt Readings from Last 3 Encounters:  10/01/17 182 lb (82.6 kg)  01/10/17 177 lb (80.3 kg)  06/28/16 180 lb (81.6 kg)   General Appearance:  Alert, cooperative, no distress, appears stated age   Head:  Normocephalic, without obvious abnormality, atraumatic   Eyes:  PERRL, conjunctiva/corneas clear, EOM's intact, fundi benign   Ears:  Normal TM's and external ear canals; no cerumen present  Nose:  Nares normal; nasal mucosa is mildly edematous, clear mucus noted on the left; sinuses nontender  Throat:  Lips, mucosa, and tongue normal; teeth and gums normal.   Neck:  Supple, no lymphadenopathy; thyroid: no enlargement/tenderness/ nodules; no carotid bruit or JVD   Back:  Spine nontender, no curvature, ROM normal, no CVA tenderness   Lungs:  Clear to auscultation bilaterally without rales or ronchi; respirations unlabored. Some mild inspiratory wheezes scattered (hasn't used her symbicort yet today).  Chest Wall:  No tenderness or deformity   Heart:  Regular rate and rhythm, S1 and S2 normal, no murmur, rub or gallop   Breast Exam:  No  tenderness, masses, or nipple discharge or inversion. No axillary lymphadenopathy   Abdomen:  Soft, non-tender, nondistended, normoactive bowel sounds, no masses, no hepatosplenomegaly   Genitalia:  Normal external genitalia without lesions; very mild atrophic changes (  mostly posteriorly). BUS and vagina normal; no cervical motion tenderness. No abnormal vaginal discharge. Uterus and adnexa not enlarged, nontender, no masses. Pap not performed.   Rectal:  Normal tone, no masses or tenderness; guaiac negative stool  Extremities:  No clubbing, cyanosis or edema.Tender at right anteromedial calcaneus, also slightly tender over posterior calcaneous (not over the tendon, but where it inserts onto bone). No swelling  Pulses:  2+ and symmetric all extremities   Skin:  Skin color, texture, turgor normal. See below for description of mole on left forearm.  Lymph nodes:  Cervical, supraclavicular, and axillary nodes normal   Neurologic:  CNII-XII intact, normal strength, sensation and gait; reflexes 2+ and symmetric throughout (mildly hyper-reflexic at knees bilaterally)  Psych: Normal mood, affect, hygiene and grooming   Right lateral forearm 49m x 532mlight brown lesion, now entirely flat, and the part that was raised is no longer raised, but is slightly darker in color.    ASSESSMENT/PLAN:  Annual physical exam - Plan: CBC with Differential/Platelet, Lipid panel, VITAMIN D 25 Hydroxy (Vit-D Deficiency, Fractures), TSH, Comprehensive metabolic panel  Asthma in remission - wheezing today; unable to perform spirometry due to computer issues. encouraged to use symbicort as directed - Plan: budesonide-formoterol (SYMBICORT) 160-4.5 MCG/ACT inhaler, montelukast (SINGULAIR) 10 MG tablet, CANCELED: Spirometry with Graph  Vitamin D deficiency - continue daily supplements - Plan: VITAMIN D 25 Hydroxy (Vit-D Deficiency, Fractures)  Mixed hyperlipidemia -  continue low cholesterol diet - Plan: Lipid panel  Postmenopausal atrophic vaginitis  Anxiety state - controlled - Plan: sertraline (ZOLOFT) 100 MG tablet  Dizziness - suspect her postprandial symptoms are related to cutting back calories, feeling hungry; cannot r/o reactive hypoglycemia. Rec Inc protein, dec carbs - Plan: CBC with Differential/Platelet, Comprehensive metabolic panel  Plantar fasciitis of right foot - discussed arch supports, stretches. May also have slight achilles tendonitis. Shown stretches. NSAIDs prn   Spirometry Cbc, c-met, TSH, lipid, vit D--return for fasting labs   Discussed monthly self breast exams and yearly mammograms; at least 30 minutes of aerobic activity at least 5 days/week ,weight-bearing exercise 2x/wk; proper sunscreen use reviewed; healthy diet, including goals of calcium and vitamin D intake and alcohol recommendations (less than or equal to 1 drink/day) reviewed; regular seatbelt use; changing batteries in smoke detectors. Immunization recommendations discussed--strongly encouraged yearly flu shots (she declines). Shingrix recommended, risks/side effects reviewed (to check with insurance and get put on list for when available). Colonoscopy recommendations reviewed--UTD, due again 05/2020. Pap UTD  Try to cut back on carbs in your diet, increase protein (egg whites, yogurt)--this helps maintain blood sugar and keeps you less hungry. Stay well hydrated. Hopefully this helps with your symptoms.  Plantar fasciitis--discussed stretches, arch supports.

## 2018-01-16 DIAGNOSIS — Z719 Counseling, unspecified: Secondary | ICD-10-CM | POA: Diagnosis not present

## 2018-01-17 ENCOUNTER — Ambulatory Visit (INDEPENDENT_AMBULATORY_CARE_PROVIDER_SITE_OTHER): Payer: 59 | Admitting: Family Medicine

## 2018-01-17 ENCOUNTER — Encounter: Payer: Self-pay | Admitting: Family Medicine

## 2018-01-17 VITALS — BP 130/86 | HR 80 | Ht 65.0 in | Wt 179.8 lb

## 2018-01-17 DIAGNOSIS — M722 Plantar fascial fibromatosis: Secondary | ICD-10-CM | POA: Diagnosis not present

## 2018-01-17 DIAGNOSIS — N952 Postmenopausal atrophic vaginitis: Secondary | ICD-10-CM | POA: Diagnosis not present

## 2018-01-17 DIAGNOSIS — E559 Vitamin D deficiency, unspecified: Secondary | ICD-10-CM | POA: Diagnosis not present

## 2018-01-17 DIAGNOSIS — F411 Generalized anxiety disorder: Secondary | ICD-10-CM

## 2018-01-17 DIAGNOSIS — Z0000 Encounter for general adult medical examination without abnormal findings: Secondary | ICD-10-CM | POA: Diagnosis not present

## 2018-01-17 DIAGNOSIS — E782 Mixed hyperlipidemia: Secondary | ICD-10-CM | POA: Diagnosis not present

## 2018-01-17 DIAGNOSIS — R42 Dizziness and giddiness: Secondary | ICD-10-CM | POA: Diagnosis not present

## 2018-01-17 DIAGNOSIS — J45998 Other asthma: Secondary | ICD-10-CM

## 2018-01-17 MED ORDER — MONTELUKAST SODIUM 10 MG PO TABS: 10.0000 mg | ORAL_TABLET | Freq: Every day | ORAL | 3 refills | Status: DC

## 2018-01-17 MED ORDER — SERTRALINE HCL 100 MG PO TABS: ORAL_TABLET | ORAL | 3 refills | Status: DC

## 2018-01-17 MED ORDER — BUDESONIDE-FORMOTEROL FUMARATE 160-4.5 MCG/ACT IN AERO: 2.0000 | INHALATION_SPRAY | Freq: Two times a day (BID) | RESPIRATORY_TRACT | 3 refills | Status: DC

## 2018-01-17 NOTE — Patient Instructions (Addendum)
HEALTH MAINTENANCE RECOMMENDATIONS:  It is recommended that you get at least 30 minutes of aerobic exercise at least 5 days/week (for weight loss, you may need as much as 60-90 minutes). This can be any activity that gets your heart rate up. This can be divided in 10-15 minute intervals if needed, but try and build up your endurance at least once a week.  Weight bearing exercise is also recommended twice weekly.  Eat a healthy diet with lots of vegetables, fruits and fiber.  "Colorful" foods have a lot of vitamins (ie green vegetables, tomatoes, red peppers, etc).  Limit sweet tea, regular sodas and alcoholic beverages, all of which has a lot of calories and sugar.  Up to 1 alcoholic drink daily may be beneficial for women (unless trying to lose weight, watch sugars).  Drink a lot of water.  Calcium recommendations are 1200-1500 mg daily (1500 mg for postmenopausal women or women without ovaries), and vitamin D 1000 IU daily.  This should be obtained from diet and/or supplements (vitamins), and calcium should not be taken all at once, but in divided doses.  Monthly self breast exams and yearly mammograms for women over the age of 71 is recommended.  Sunscreen of at least SPF 30 should be used on all sun-exposed parts of the skin when outside between the hours of 10 am and 4 pm (not just when at beach or pool, but even with exercise, golf, tennis, and yard work!)  Use a sunscreen that says "broad spectrum" so it covers both UVA and UVB rays, and make sure to reapply every 1-2 hours.  Remember to change the batteries in your smoke detectors when changing your clock times in the spring and fall.  Use your seat belt every time you are in a car, and please drive safely and not be distracted with cell phones and texting while driving.  I recommend getting the new shingles vaccine (Shingrix). You will need to check with your insurance to see if it is covered, and if covered by Medicare Part D, you need to  get from the pharmacy rather than our office.  It is a series of 2 injections, spaced 2 months apart.   Try to cut back on carbs in your diet, increase protein (egg whites, yogurt)--this helps maintain blood sugar and keeps you less hungry. Stay well hydrated. Hopefully this helps with your symptoms.    Plantar Fasciitis Plantar fasciitis is a painful foot condition that affects the heel. It occurs when the band of tissue that connects the toes to the heel bone (plantar fascia) becomes irritated. This can happen after exercising too much or doing other repetitive activities (overuse injury). The pain from plantar fasciitis can range from mild irritation to severe pain that makes it difficult for you to walk or move. The pain is usually worse in the morning or after you have been sitting or lying down for a while. What are the causes? This condition may be caused by:  Standing for long periods of time.  Wearing shoes that do not fit.  Doing high-impact activities, including running, aerobics, and ballet.  Being overweight.  Having an abnormal way of walking (gait).  Having tight calf muscles.  Having high arches in your feet.  Starting a new athletic activity.  What are the signs or symptoms? The main symptom of this condition is heel pain. Other symptoms include:  Pain that gets worse after activity or exercise.  Pain that is worse in the morning  or after resting.  Pain that goes away after you walk for a few minutes.  How is this diagnosed? This condition may be diagnosed based on your signs and symptoms. Your health care provider will also do a physical exam to check for:  A tender area on the bottom of your foot.  A high arch in your foot.  Pain when you move your foot.  Difficulty moving your foot.  You may also need to have imaging studies to confirm the diagnosis. These can include:  X-rays.  Ultrasound.  MRI.  How is this treated? Treatment for  plantar fasciitis depends on the severity of the condition. Your treatment may include:  Rest, ice, and over-the-counter pain medicines to manage your pain.  Exercises to stretch your calves and your plantar fascia.  A splint that holds your foot in a stretched, upward position while you sleep (night splint).  Physical therapy to relieve symptoms and prevent problems in the future.  Cortisone injections to relieve severe pain.  Extracorporeal shock wave therapy (ESWT) to stimulate damaged plantar fascia with electrical impulses. It is often used as a last resort before surgery.  Surgery, if other treatments have not worked after 12 months.  Follow these instructions at home:  Take medicines only as directed by your health care provider.  Avoid activities that cause pain.  Roll the bottom of your foot over a bag of ice or a bottle of cold water. Do this for 20 minutes, 3-4 times a day.  Perform simple stretches as directed by your health care provider.  Try wearing athletic shoes with air-sole or gel-sole cushions or soft shoe inserts.  Wear a night splint while sleeping, if directed by your health care provider.  Keep all follow-up appointments with your health care provider. How is this prevented?  Do not perform exercises or activities that cause heel pain.  Consider finding low-impact activities if you continue to have problems.  Lose weight if you need to. The best way to prevent plantar fasciitis is to avoid the activities that aggravate your plantar fascia. Contact a health care provider if:  Your symptoms do not go away after treatment with home care measures.  Your pain gets worse.  Your pain affects your ability to move or do your daily activities. This information is not intended to replace advice given to you by your health care provider. Make sure you discuss any questions you have with your health care provider. Document Released: 07/11/2001 Document  Revised: 03/20/2016 Document Reviewed: 08/26/2014 Elsevier Interactive Patient Education  2018 [place] in the blanks with: holding for 10-15 seconds, 10 repetitions, twice daily.  Plantar Fasciitis Rehab Ask your health care provider which exercises are safe for you. Do exercises exactly as told by your health care provider and adjust them as directed. It is normal to feel mild stretching, pulling, tightness, or discomfort as you do these exercises, but you should stop right away if you feel sudden pain or your pain gets worse. Do not begin these exercises until told by your health care provider. Stretching and range of motion exercises These exercises warm up your muscles and joints and improve the movement and flexibility of your foot. These exercises also help to relieve pain. Exercise A: Plantar fascia stretch  1. Sit with your left / right leg crossed over your opposite knee. 2. Hold your heel with one hand with that thumb near your arch. With your other hand, hold your toes and gently  pull them back toward the top of your foot. You should feel a stretch on the bottom of your toes or your foot or both. 3. Hold this stretch for__________ seconds. 4. Slowly release your toes and return to the starting position. Repeat __________ times. Complete this exercise __________ times a day. Exercise B: Gastroc, standing  1. Stand with your hands against a wall. 2. Extend your left / right leg behind you, and bend your front knee slightly. 3. Keeping your heels on the floor and keeping your back knee straight, shift your weight toward the wall without arching your back. You should feel a gentle stretch in your left / right calf. 4. Hold this position for __________ seconds. Repeat __________ times. Complete this exercise __________ times a day. Exercise C: Soleus, standing 1. Stand with your hands against a wall. 2. Extend your left / right leg behind you, and bend your front knee  slightly. 3. Keeping your heels on the floor, bend your back knee and slightly shift your weight over the back leg. You should feel a gentle stretch deep in your calf. 4. Hold this position for __________ seconds. Repeat __________ times. Complete this exercise __________ times a day. Exercise D: Gastrocsoleus, standing 1. Stand with the ball of your left / right foot on a step. The ball of your foot is on the walking surface, right under your toes. 2. Keep your other foot firmly on the same step. 3. Hold onto the wall or a railing for balance. 4. Slowly lift your other foot, allowing your body weight to press your heel down over the edge of the step. You should feel a stretch in your left / right calf. 5. Hold this position for __________ seconds. 6. Return both feet to the step. 7. Repeat this exercise with a slight bend in your left / right knee. Repeat __________ times with your left / right knee straight and __________ times with your left / right knee bent. Complete this exercise __________ times a day. Balance exercise This exercise builds your balance and strength control of your arch to help take pressure off your plantar fascia. Exercise E: Single leg stand 1. Without shoes, stand near a railing or in a doorway. You may hold onto the railing or door frame as needed. 2. Stand on your left / right foot. Keep your big toe down on the floor and try to keep your arch lifted. Do not let your foot roll inward. 3. Hold this position for __________ seconds. 4. If this exercise is too easy, you can try it with your eyes closed or while standing on a pillow. Repeat __________ times. Complete this exercise __________ times a day. This information is not intended to replace advice given to you by your health care provider. Make sure you discuss any questions you have with your health care provider. Document Released: 10/16/2005 Document Revised: 06/20/2016 Document Reviewed: 08/30/2015 Elsevier  Interactive Patient Education  2018 [employer].

## 2018-01-21 ENCOUNTER — Other Ambulatory Visit: Payer: 59

## 2018-01-21 DIAGNOSIS — R42 Dizziness and giddiness: Secondary | ICD-10-CM | POA: Diagnosis not present

## 2018-01-21 DIAGNOSIS — E559 Vitamin D deficiency, unspecified: Secondary | ICD-10-CM | POA: Diagnosis not present

## 2018-01-21 DIAGNOSIS — E782 Mixed hyperlipidemia: Secondary | ICD-10-CM | POA: Diagnosis not present

## 2018-01-21 DIAGNOSIS — Z0000 Encounter for general adult medical examination without abnormal findings: Secondary | ICD-10-CM

## 2018-01-21 DIAGNOSIS — J45998 Other asthma: Secondary | ICD-10-CM

## 2018-01-22 LAB — COMPREHENSIVE METABOLIC PANEL
ALK PHOS: 84 IU/L (ref 39–117)
ALT: 25 IU/L (ref 0–32)
AST: 17 IU/L (ref 0–40)
Albumin/Globulin Ratio: 1.9 (ref 1.2–2.2)
Albumin: 4.4 g/dL (ref 3.5–5.5)
BUN/Creatinine Ratio: 24 — ABNORMAL HIGH (ref 9–23)
BUN: 16 mg/dL (ref 6–24)
Bilirubin Total: 0.3 mg/dL (ref 0.0–1.2)
CALCIUM: 9.6 mg/dL (ref 8.7–10.2)
CHLORIDE: 103 mmol/L (ref 96–106)
CO2: 24 mmol/L (ref 20–29)
Creatinine, Ser: 0.66 mg/dL (ref 0.57–1.00)
GFR calc Af Amer: 114 mL/min/{1.73_m2} (ref >59)
GFR, EST NON AFRICAN AMERICAN: 99 mL/min/{1.73_m2} (ref >59)
GLOBULIN, TOTAL: 2.3 g/dL (ref 1.5–4.5)
Glucose: 109 mg/dL — ABNORMAL HIGH (ref 65–99)
Potassium: 5.3 mmol/L — ABNORMAL HIGH (ref 3.5–5.2)
SODIUM: 141 mmol/L (ref 134–144)
Total Protein: 6.7 g/dL (ref 6.0–8.5)

## 2018-01-22 LAB — CBC WITH DIFFERENTIAL/PLATELET
BASOS: 1 %
Basophils Absolute: 0 10*3/uL (ref 0.0–0.2)
EOS (ABSOLUTE): 0.4 10*3/uL (ref 0.0–0.4)
Eos: 8 %
HEMATOCRIT: 41.1 % (ref 34.0–46.6)
Hemoglobin: 13.9 g/dL (ref 11.1–15.9)
IMMATURE GRANS (ABS): 0 10*3/uL (ref 0.0–0.1)
IMMATURE GRANULOCYTES: 0 %
LYMPHS: 28 %
Lymphocytes Absolute: 1.5 10*3/uL (ref 0.7–3.1)
MCH: 30.8 pg (ref 26.6–33.0)
MCHC: 33.8 g/dL (ref 31.5–35.7)
MCV: 91 fL (ref 79–97)
MONOCYTES: 7 %
MONOS ABS: 0.4 10*3/uL (ref 0.1–0.9)
NEUTROS PCT: 56 %
Neutrophils Absolute: 3.1 10*3/uL (ref 1.4–7.0)
Platelets: 235 10*3/uL (ref 150–379)
RBC: 4.51 x10E6/uL (ref 3.77–5.28)
RDW: 13 % (ref 12.3–15.4)
WBC: 5.4 10*3/uL (ref 3.4–10.8)

## 2018-01-22 LAB — TSH: TSH: 1.74 u[IU]/mL (ref 0.450–4.500)

## 2018-01-22 LAB — LIPID PANEL
CHOLESTEROL TOTAL: 197 mg/dL (ref 100–199)
Chol/HDL Ratio: 3.2 ratio (ref 0.0–4.4)
HDL: 61 mg/dL (ref >39)
LDL CALC: 116 mg/dL — AB (ref 0–99)
TRIGLYCERIDES: 98 mg/dL (ref 0–149)
VLDL Cholesterol Cal: 20 mg/dL (ref 5–40)

## 2018-01-22 LAB — VITAMIN D 25 HYDROXY (VIT D DEFICIENCY, FRACTURES): VIT D 25 HYDROXY: 25.7 ng/mL — AB (ref 30.0–100.0)

## 2018-01-23 DIAGNOSIS — Z719 Counseling, unspecified: Secondary | ICD-10-CM | POA: Diagnosis not present

## 2018-01-30 ENCOUNTER — Telehealth: Payer: Self-pay | Admitting: *Deleted

## 2018-01-30 DIAGNOSIS — Z719 Counseling, unspecified: Secondary | ICD-10-CM | POA: Diagnosis not present

## 2018-01-30 NOTE — Telephone Encounter (Signed)
I was going to do b-met (due to her K+ being up), vit D and A1c.  So have her be fasting.  thanks

## 2018-01-30 NOTE — Telephone Encounter (Signed)
Called patient to go over lab results. Was going to schedule her for 6 months. Do you want her to be fasting? Or are you just going to do A1C @ the visit?

## 2018-02-06 DIAGNOSIS — Z719 Counseling, unspecified: Secondary | ICD-10-CM | POA: Diagnosis not present

## 2018-02-13 DIAGNOSIS — Z719 Counseling, unspecified: Secondary | ICD-10-CM | POA: Diagnosis not present

## 2018-02-20 DIAGNOSIS — Z719 Counseling, unspecified: Secondary | ICD-10-CM | POA: Diagnosis not present

## 2018-02-27 DIAGNOSIS — Z719 Counseling, unspecified: Secondary | ICD-10-CM | POA: Diagnosis not present

## 2018-03-06 DIAGNOSIS — Z719 Counseling, unspecified: Secondary | ICD-10-CM | POA: Diagnosis not present

## 2018-05-14 DIAGNOSIS — M9903 Segmental and somatic dysfunction of lumbar region: Secondary | ICD-10-CM | POA: Diagnosis not present

## 2018-05-14 DIAGNOSIS — M9904 Segmental and somatic dysfunction of sacral region: Secondary | ICD-10-CM | POA: Diagnosis not present

## 2018-05-14 DIAGNOSIS — M9905 Segmental and somatic dysfunction of pelvic region: Secondary | ICD-10-CM | POA: Diagnosis not present

## 2018-05-17 DIAGNOSIS — M9903 Segmental and somatic dysfunction of lumbar region: Secondary | ICD-10-CM | POA: Diagnosis not present

## 2018-05-17 DIAGNOSIS — M9905 Segmental and somatic dysfunction of pelvic region: Secondary | ICD-10-CM | POA: Diagnosis not present

## 2018-05-17 DIAGNOSIS — M9904 Segmental and somatic dysfunction of sacral region: Secondary | ICD-10-CM | POA: Diagnosis not present

## 2018-05-20 DIAGNOSIS — M9903 Segmental and somatic dysfunction of lumbar region: Secondary | ICD-10-CM | POA: Diagnosis not present

## 2018-05-20 DIAGNOSIS — M9904 Segmental and somatic dysfunction of sacral region: Secondary | ICD-10-CM | POA: Diagnosis not present

## 2018-05-20 DIAGNOSIS — M9905 Segmental and somatic dysfunction of pelvic region: Secondary | ICD-10-CM | POA: Diagnosis not present

## 2018-05-27 DIAGNOSIS — M9905 Segmental and somatic dysfunction of pelvic region: Secondary | ICD-10-CM | POA: Diagnosis not present

## 2018-05-27 DIAGNOSIS — M9903 Segmental and somatic dysfunction of lumbar region: Secondary | ICD-10-CM | POA: Diagnosis not present

## 2018-05-27 DIAGNOSIS — M9904 Segmental and somatic dysfunction of sacral region: Secondary | ICD-10-CM | POA: Diagnosis not present

## 2018-05-31 DIAGNOSIS — J069 Acute upper respiratory infection, unspecified: Secondary | ICD-10-CM | POA: Diagnosis not present

## 2018-05-31 DIAGNOSIS — J029 Acute pharyngitis, unspecified: Secondary | ICD-10-CM | POA: Diagnosis not present

## 2018-06-05 ENCOUNTER — Encounter: Payer: Self-pay | Admitting: Family Medicine

## 2018-06-05 NOTE — Progress Notes (Signed)
Chief Complaint  Patient presents with  . Follow-up    urgent care follow up for sore throat (not strep) still painful to swallow     Patient presents for urgent care f/u for sore throat and swollen glands.  She reports that she picked up a virus last week, had extremely sore throat and swollen lymph glands. She went to urgent care 8/2  and tested negative for strep, told she had a virus. She was prescribed prednisone, lidocaine (viscous), pain meds (tylenol with codeine).  She sent message yesterday reporting that symptoms were "still lingering , but better, except for the constant earache and some sensitivity swallowing".  She is asking about need to see ENT specialist-- "this earache and swollen gland thing has been persistent over the past few years."   Denies fever.  She has persistent fatigue, but is improving  Way on the back of her tongue, on both sides, it feels "different", burning sensation.  She has stabbing pain in her right ear, like she gets with every illness, back now, but doesn't stay there between illnesses. She denies any sinus congestion/drainage  PMH, PSH, SH reviewed  Outpatient Encounter Medications as of 06/06/2018  Medication Sig Note  . albuterol (PROVENTIL HFA;VENTOLIN HFA) 108 (90 BASE) MCG/ACT inhaler Inhale 1-2 puffs into the lungs every 6 (six) hours as needed for wheezing or shortness of breath. Reported on 01/05/2016 01/05/2016: Last needed in Fall  . Ascorbic Acid (VITAMIN C) 1000 MG tablet Take 1,000 mg by mouth daily.   Marland Kitchen aspirin 81 MG chewable tablet Chew 1 tablet (81 mg total) by mouth daily.   . budesonide-formoterol (SYMBICORT) 160-4.5 MCG/ACT inhaler Inhale 2 puffs into the lungs 2 (two) times daily. 06/06/2018: Hasn't used since illness, because throat is too irritated  . fluticasone (FLONASE) 50 MCG/ACT nasal spray Place 2 sprays into both nostrils daily. 01/05/2016: Uses prn, not currently  . lidocaine (XYLOCAINE) 2 % solution Take by mouth.   .  montelukast (SINGULAIR) 10 MG tablet Take 1 tablet (10 mg total) by mouth at bedtime.   . sertraline (ZOLOFT) 100 MG tablet TAKE 1 BY MOUTH DAILY   . [DISCONTINUED] ALPRAZolam (XANAX) 0.5 MG tablet Take 0.5-1 tablets (0.25-0.5 mg total) by mouth 3 (three) times daily as needed for anxiety. (Patient not taking: Reported on 06/28/2016)   . [DISCONTINUED] conjugated estrogens (PREMARIN) vaginal cream Apply one applicator intravaginally 2-3 times/week as needed for vaginal dryness    No facility-administered encounter medications on file as of 06/06/2018.    Allergies  Allergen Reactions  . Erythromycin Nausea And Vomiting    ROS:  No fever, headache, dizziness, chest pain. No nausea, vomiting, diarrhea, bleeding, bruising, rashes. No tick bites, myalgias. Seeing chiro for LBP.  PHYSICAL EXAM:  BP 126/84   Pulse 82   Temp 98 F (36.7 C) (Oral)   Ht 5\' 5"  (1.651 m)   Wt 178 lb 6.4 oz (80.9 kg)   SpO2 97%   BMI 29.69 kg/m  Well appearing, pleasant female in no distress HEENT: PERRL, EOMI, conjunctiva and sclera are clear. TM's and EAC's are normal. Nasal mucosa is notable for mild edema, clear mucus. OP: erythematous posteriorly, with 2 ulcerations on the left ATP 1 large ulceration on the right. Remainder of mucosa is normal. Moist mucus membranes.  Sinuses nontender Neck: No lymphadenopathy or mass Heart: regular rate and rhythm Lungs: clear bilaterally Skin: normal turgor, no rashes.  ASSESSMENT/PLAN:  Ulcer of the throat - bilateral, suspect coxsackie virus.  Supportive  management  Treat with Magic Mouthwash (Lidocaine, nystatin, mylanta, benadryl) QID prn. Avoid acidic foods. Tylenol and/or NSAIDs for pain Ear pain is referred from throat, reassured.

## 2018-06-06 ENCOUNTER — Encounter: Payer: Self-pay | Admitting: Family Medicine

## 2018-06-06 ENCOUNTER — Ambulatory Visit (INDEPENDENT_AMBULATORY_CARE_PROVIDER_SITE_OTHER): Payer: 59 | Admitting: Family Medicine

## 2018-06-06 VITALS — BP 126/84 | HR 82 | Temp 98.0°F | Ht 65.0 in | Wt 178.4 lb

## 2018-06-06 DIAGNOSIS — J387 Other diseases of larynx: Secondary | ICD-10-CM | POA: Diagnosis not present

## 2018-06-06 DIAGNOSIS — M9904 Segmental and somatic dysfunction of sacral region: Secondary | ICD-10-CM | POA: Diagnosis not present

## 2018-06-06 DIAGNOSIS — M9905 Segmental and somatic dysfunction of pelvic region: Secondary | ICD-10-CM | POA: Diagnosis not present

## 2018-06-06 DIAGNOSIS — M9903 Segmental and somatic dysfunction of lumbar region: Secondary | ICD-10-CM | POA: Diagnosis not present

## 2018-06-06 MED ORDER — MAGIC MOUTHWASH W/LIDOCAINE: 5.0000 mL | Freq: Four times a day (QID) | ORAL | 0 refills | Status: DC | PRN

## 2018-06-06 NOTE — Patient Instructions (Signed)
There are ulcers in the back of your throat.  This is usually due to a virus called Coxsackie virus. These will eventually go away on their own, but can be extremely painful. In addition to lidocaine, sometimes using benadryl, mylanta can help. We are going to call in a prescription for Magic Mouthwash. You should gargle and spit 4x daily, as needed. I don't think you need to swallow it (will make you more sleepy from the benadryl), since I can see the ulcers, doubt there are more further down.  Use tylenol or anti-inflammatories as needed for pain as well. Avoid all citrus/acidic foods, and hard foods (like chips) that will scrape as you swallow.

## 2018-06-11 DIAGNOSIS — M9904 Segmental and somatic dysfunction of sacral region: Secondary | ICD-10-CM | POA: Diagnosis not present

## 2018-06-11 DIAGNOSIS — M9905 Segmental and somatic dysfunction of pelvic region: Secondary | ICD-10-CM | POA: Diagnosis not present

## 2018-06-11 DIAGNOSIS — M9903 Segmental and somatic dysfunction of lumbar region: Secondary | ICD-10-CM | POA: Diagnosis not present

## 2018-06-17 DIAGNOSIS — M9905 Segmental and somatic dysfunction of pelvic region: Secondary | ICD-10-CM | POA: Diagnosis not present

## 2018-06-17 DIAGNOSIS — M9903 Segmental and somatic dysfunction of lumbar region: Secondary | ICD-10-CM | POA: Diagnosis not present

## 2018-06-17 DIAGNOSIS — M9904 Segmental and somatic dysfunction of sacral region: Secondary | ICD-10-CM | POA: Diagnosis not present

## 2018-07-05 DIAGNOSIS — M9904 Segmental and somatic dysfunction of sacral region: Secondary | ICD-10-CM | POA: Diagnosis not present

## 2018-07-05 DIAGNOSIS — M9905 Segmental and somatic dysfunction of pelvic region: Secondary | ICD-10-CM | POA: Diagnosis not present

## 2018-07-05 DIAGNOSIS — M9903 Segmental and somatic dysfunction of lumbar region: Secondary | ICD-10-CM | POA: Diagnosis not present

## 2018-07-10 DIAGNOSIS — M9905 Segmental and somatic dysfunction of pelvic region: Secondary | ICD-10-CM | POA: Diagnosis not present

## 2018-07-10 DIAGNOSIS — M9904 Segmental and somatic dysfunction of sacral region: Secondary | ICD-10-CM | POA: Diagnosis not present

## 2018-07-10 DIAGNOSIS — M9903 Segmental and somatic dysfunction of lumbar region: Secondary | ICD-10-CM | POA: Diagnosis not present

## 2018-07-10 DIAGNOSIS — Z1231 Encounter for screening mammogram for malignant neoplasm of breast: Secondary | ICD-10-CM | POA: Diagnosis not present

## 2018-07-10 LAB — HM MAMMOGRAPHY

## 2018-07-15 ENCOUNTER — Encounter: Payer: Self-pay | Admitting: *Deleted

## 2018-07-22 DIAGNOSIS — M9905 Segmental and somatic dysfunction of pelvic region: Secondary | ICD-10-CM | POA: Diagnosis not present

## 2018-07-22 DIAGNOSIS — M9903 Segmental and somatic dysfunction of lumbar region: Secondary | ICD-10-CM | POA: Diagnosis not present

## 2018-07-22 DIAGNOSIS — M9904 Segmental and somatic dysfunction of sacral region: Secondary | ICD-10-CM | POA: Diagnosis not present

## 2018-07-29 ENCOUNTER — Ambulatory Visit: Payer: Self-pay | Admitting: Family Medicine

## 2018-07-29 DIAGNOSIS — R7301 Impaired fasting glucose: Secondary | ICD-10-CM | POA: Insufficient documentation

## 2018-07-29 NOTE — Progress Notes (Signed)
Chief Complaint  Patient presents with  . Med Check    fasting med check (blood in lab). No new concerns.    Last seen about 7 weeks ago with sore throat and right sided oral ulcer. This completely resolved.  Pre-diabetes/IFG:  Fasting glucose was 109 at her physical in March.  Since then she has been exercising more, watching her carbs more closely (isn't a sweet eater). Eating more fruits and vegetables.  No sugar-sweetened beverages (rare lemonade, which she waters down).  Anxiety: She remains on Sertraline.Anxiety remains well controlled.Denies side effectsto sertraline. She had been having an "anxiety rush" right before a hot flash, but these are decreasing.  Alprazolam was last refilled 12/2015,doesn't have any left, but hasn't needed any. Not interested in changing dose/tapering, as she has had significant stressors continue to periodically pop up.  Asthma and allergies: Doing well overall.Breathing is good--hasn't needed to use a rescue in a long time (can't even recall). Uses Symbicort just in the morning, and in the afternoon just prn (not recently). Takes montelukast at night.  Spirometry was normal in 12/2017.  High cholesterol: Lipids were high in 12/2014 when eating a lot of bacon at work. She improved her diet, and rechecks have beenbetter. She continues on lowfat, low cholesterol diet.  Last check was 12/2017: Lab Results  Component Value Date   CHOL 197 01/21/2018   HDL 61 01/21/2018   LDLCALC 116 (H) 01/21/2018   TRIG 98 01/21/2018   CHOLHDL 3.2 01/21/2018    H/o Vitamin D deficiency. Last level was 25.7 in March 2019.  She had been taking 1000 IU daily of OTC D3, and it was recommended that she increase the dose to 2000 IU. She admits to some noncompliance with this (forgets), but got a lot of sun over the summer. (was trying to take with food; takes other meds at night).  PMH, PSH, SH reviewed  Outpatient Encounter Medications as of 07/30/2018  Medication Sig  Note  . Ascorbic Acid (VITAMIN C) 1000 MG tablet Take 1,000 mg by mouth daily.   Marland Kitchen aspirin 81 MG chewable tablet Chew 1 tablet (81 mg total) by mouth daily.   . budesonide-formoterol (SYMBICORT) 160-4.5 MCG/ACT inhaler Inhale 2 puffs into the lungs 2 (two) times daily. 06/06/2018: Hasn't used since illness, because throat is too irritated  . montelukast (SINGULAIR) 10 MG tablet Take 1 tablet (10 mg total) by mouth at bedtime.   . sertraline (ZOLOFT) 100 MG tablet TAKE 1 BY MOUTH DAILY   . [DISCONTINUED] cholecalciferol (VITAMIN D) 1000 units tablet    . albuterol (PROVENTIL HFA;VENTOLIN HFA) 108 (90 BASE) MCG/ACT inhaler Inhale 1-2 puffs into the lungs every 6 (six) hours as needed for wheezing or shortness of breath. Reported on 01/05/2016 01/05/2016: Last needed in Fall  . fluticasone (FLONASE) 50 MCG/ACT nasal spray Place 2 sprays into both nostrils daily. (Patient not taking: Reported on 06/06/2018) 01/05/2016: Uses prn, not currently  . [DISCONTINUED] lidocaine (XYLOCAINE) 2 % solution Take by mouth.   . [DISCONTINUED] magic mouthwash w/lidocaine SOLN Take 5 mLs by mouth 4 (four) times daily as needed for mouth pain.    No facility-administered encounter medications on file as of 07/30/2018.    Allergies  Allergen Reactions  . Erythromycin Nausea And Vomiting   ROS: no fever, chills, URI symptoms, headaches, dizziness, chest pain, GI or GU complaints. No bleeding, bruising, rash. She has noticed that she has been dropping things more (both hands)--No numbness, tingling, neck pain, weakness  PHYSICAL EXAM:  BP 122/78   Pulse 84   Ht 5\' 5"  (1.651 m)   Wt 173 lb 6.4 oz (78.7 kg)   BMI 28.86 kg/m   Wt Readings from Last 3 Encounters:  07/30/18 173 lb 6.4 oz (78.7 kg)  06/06/18 178 lb 6.4 oz (80.9 kg)  01/17/18 179 lb 12.8 oz (81.6 kg)    Well developed, pleasant female in no distress HEENT: EOMI, conjunctiva clear. OP clear Neck: no lymphadenopathy, thyromegaly or carotid bruit Heart:  regular rate and rhythm without murmur Lungs: clear bilaterally, no wheezes, rales, ronchi Abdomen: soft, nontender, no organomegaly or mass Back: no CVA or spinal tenderness Extremities: no edema, 2+ pulses Psych:  Normal mood, affect, hygiene, grooming, eye contact and speech Neuro: alert and oriented.  Cranial nerve intact. Normal gait Skin: normal turgor, no rash   Lab Results  Component Value Date   HGBA1C 5.6 07/30/2018    ASSESSMENT/PLAN:  Impaired fasting glucose - normal A1c today, has lost weight, diet improved - Plan: HgB B3X, Basic metabolic panel  Vitamin D deficiency - noncompliant recently. Discussed taking meds qHS with her montelukast - Plan: VITAMIN D 25 Hydroxy (Vit-D Deficiency, Fractures)  Asthma in remission - continue current med regimen  Serum potassium elevated - recheck today - Plan: Basic metabolic panel  Need for influenza vaccination - Plan: Flu Vaccine QUAD 6+ mos PF IM (Fluarix Quad PF)   Please try switching taking your 2000 IU of vitamin D3 at bedtime, along with your montelukast, so make it easier to remember. If for some reason you prefer to take it in the morning, double up and take two (the 3-4 times/week you remember) vs buying 5000 IU and taking it 3x/week.

## 2018-07-30 ENCOUNTER — Ambulatory Visit (INDEPENDENT_AMBULATORY_CARE_PROVIDER_SITE_OTHER): Payer: 59 | Admitting: Family Medicine

## 2018-07-30 ENCOUNTER — Encounter: Payer: Self-pay | Admitting: Family Medicine

## 2018-07-30 VITALS — BP 122/78 | HR 84 | Ht 65.0 in | Wt 173.4 lb

## 2018-07-30 DIAGNOSIS — J45998 Other asthma: Secondary | ICD-10-CM

## 2018-07-30 DIAGNOSIS — E875 Hyperkalemia: Secondary | ICD-10-CM

## 2018-07-30 DIAGNOSIS — Z23 Encounter for immunization: Secondary | ICD-10-CM | POA: Diagnosis not present

## 2018-07-30 DIAGNOSIS — R7301 Impaired fasting glucose: Secondary | ICD-10-CM | POA: Diagnosis not present

## 2018-07-30 DIAGNOSIS — E559 Vitamin D deficiency, unspecified: Secondary | ICD-10-CM

## 2018-07-30 LAB — POCT GLYCOSYLATED HEMOGLOBIN (HGB A1C): Hemoglobin A1C: 5.6 % (ref 4.0–5.6)

## 2018-07-31 LAB — BASIC METABOLIC PANEL
BUN/Creatinine Ratio: 15 (ref 9–23)
BUN: 12 mg/dL (ref 6–24)
CHLORIDE: 101 mmol/L (ref 96–106)
CO2: 25 mmol/L (ref 20–29)
Calcium: 9.8 mg/dL (ref 8.7–10.2)
Creatinine, Ser: 0.79 mg/dL (ref 0.57–1.00)
GFR, EST AFRICAN AMERICAN: 97 mL/min/{1.73_m2} (ref >59)
GFR, EST NON AFRICAN AMERICAN: 84 mL/min/{1.73_m2} (ref >59)
Glucose: 92 mg/dL (ref 65–99)
POTASSIUM: 4.7 mmol/L (ref 3.5–5.2)
SODIUM: 139 mmol/L (ref 134–144)

## 2018-07-31 LAB — VITAMIN D 25 HYDROXY (VIT D DEFICIENCY, FRACTURES): Vit D, 25-Hydroxy: 27.6 ng/mL — ABNORMAL LOW (ref 30.0–100.0)

## 2018-08-01 DIAGNOSIS — M9903 Segmental and somatic dysfunction of lumbar region: Secondary | ICD-10-CM | POA: Diagnosis not present

## 2018-08-01 DIAGNOSIS — M9904 Segmental and somatic dysfunction of sacral region: Secondary | ICD-10-CM | POA: Diagnosis not present

## 2018-08-01 DIAGNOSIS — M9905 Segmental and somatic dysfunction of pelvic region: Secondary | ICD-10-CM | POA: Diagnosis not present

## 2018-08-22 DIAGNOSIS — M9905 Segmental and somatic dysfunction of pelvic region: Secondary | ICD-10-CM | POA: Diagnosis not present

## 2018-08-22 DIAGNOSIS — M9904 Segmental and somatic dysfunction of sacral region: Secondary | ICD-10-CM | POA: Diagnosis not present

## 2018-08-22 DIAGNOSIS — M9903 Segmental and somatic dysfunction of lumbar region: Secondary | ICD-10-CM | POA: Diagnosis not present

## 2018-09-17 DIAGNOSIS — M9905 Segmental and somatic dysfunction of pelvic region: Secondary | ICD-10-CM | POA: Diagnosis not present

## 2018-09-17 DIAGNOSIS — M9904 Segmental and somatic dysfunction of sacral region: Secondary | ICD-10-CM | POA: Diagnosis not present

## 2018-09-17 DIAGNOSIS — M9903 Segmental and somatic dysfunction of lumbar region: Secondary | ICD-10-CM | POA: Diagnosis not present

## 2018-10-08 DIAGNOSIS — M9904 Segmental and somatic dysfunction of sacral region: Secondary | ICD-10-CM | POA: Diagnosis not present

## 2018-10-08 DIAGNOSIS — M9903 Segmental and somatic dysfunction of lumbar region: Secondary | ICD-10-CM | POA: Diagnosis not present

## 2018-10-08 DIAGNOSIS — M9905 Segmental and somatic dysfunction of pelvic region: Secondary | ICD-10-CM | POA: Diagnosis not present

## 2018-11-05 DIAGNOSIS — M9903 Segmental and somatic dysfunction of lumbar region: Secondary | ICD-10-CM | POA: Diagnosis not present

## 2018-11-05 DIAGNOSIS — M9905 Segmental and somatic dysfunction of pelvic region: Secondary | ICD-10-CM | POA: Diagnosis not present

## 2018-11-05 DIAGNOSIS — M9904 Segmental and somatic dysfunction of sacral region: Secondary | ICD-10-CM | POA: Diagnosis not present

## 2018-12-03 DIAGNOSIS — M9905 Segmental and somatic dysfunction of pelvic region: Secondary | ICD-10-CM | POA: Diagnosis not present

## 2018-12-03 DIAGNOSIS — M9903 Segmental and somatic dysfunction of lumbar region: Secondary | ICD-10-CM | POA: Diagnosis not present

## 2018-12-03 DIAGNOSIS — M9904 Segmental and somatic dysfunction of sacral region: Secondary | ICD-10-CM | POA: Diagnosis not present

## 2018-12-30 ENCOUNTER — Encounter: Payer: Self-pay | Admitting: Family Medicine

## 2019-01-07 DIAGNOSIS — M9905 Segmental and somatic dysfunction of pelvic region: Secondary | ICD-10-CM | POA: Diagnosis not present

## 2019-01-07 DIAGNOSIS — M9904 Segmental and somatic dysfunction of sacral region: Secondary | ICD-10-CM | POA: Diagnosis not present

## 2019-01-07 DIAGNOSIS — M9903 Segmental and somatic dysfunction of lumbar region: Secondary | ICD-10-CM | POA: Diagnosis not present

## 2019-01-13 ENCOUNTER — Encounter: Payer: Self-pay | Admitting: Family Medicine

## 2019-01-19 NOTE — Progress Notes (Signed)
Chief Complaint  Patient presents with  . Annual Exam    fasting annual exam with pelvic. Sees eye doctor for annual eye exam. Will try to give UA on way out. Did mentions some right ear hearing loss and would like to change anxiety med.     Hannah Werner is a 58 y.o. female who presents for a complete physical.  She has the following concerns:  She has noted worsening hearing in her right ear (where she also has some ringing in her ear, intermittently).  People at work have told her to get her hearing checked, needs to have people repeat things, especially if there is background noise.  She has h/o anxiety, has been on sertraline for years with good control. She reports that she doesn't feel that the medication seems as effective lately. In the last couple of months she has noticed increased anxiety--feels like she is coming out of her skin.  Feels tense, clenching.  Denies mind racing or having any trouble falling asleep.  Similar to what she felt prior to her hot flashes (which she no longer gets).  Occurs at least once or twice daily.  They are short-lived, a few minutes, resolves with some relaxation techniques/breathing. Happens more at night, when trying to fall asleep.  No trouble falling or staying asleep.  Denies any full panic attack.  Alprazolam was last refilled 12/2015, doesn't have any left, but hadn't needed in well over a year. She declines a refill today, but will call if longer-lasting or full blown panic attacks occur.  Pre-diabetes/IFG:  Fasting glucose was 109 at her physical in March.  Since then she has been exercising more, watching her carbs more closely, eating more fruits and vegetables.  No sugar-sweetened beverages (rare lemonade, which she waters down). Fasting sugar was 92 in October 2019. Lab Results  Component Value Date   HGBA1C 5.6 07/30/2018   Asthma and allergies: Doing well overall.Breathing is good--hasn't needed to use a rescue in a long time (can't  even recall). Uses Symbicort just in the morning, and in the afternoon just prn (not recently), usually during allergy season and when sick.Takes montelukast at night.  Spirometry was normal in 12/2017.  High cholesterol: Lipids were high in 12/2014 when eating a lot of bacon at work. She improved her diet, and rechecks have beenbetter. She continues on lowfat, low cholesterol diet.  Last check was 12/2017. Lab Results  Component Value Date   CHOL 197 01/21/2018   HDL 61 01/21/2018   LDLCALC 116 (H) 01/21/2018   TRIG 98 01/21/2018   CHOLHDL 3.2 01/21/2018   H/o Vitamin D deficiency. Last level was 27.6 in October 2019.  She had reported some noncompliance, often forgetting to take.  Prior to that, level had been low when taking 1000 IU daily of OTC D3. Recommendation was to take 2000 IU daily. She has been taking 2000 IU, misses infrequently.  Immunization History  Administered Date(s) Administered  . DT 04/19/2000  . Influenza Split 08/14/2011, 07/30/2014  . Influenza, Seasonal, Injecte, Preservative Fre 12/23/2012  . Influenza,inj,Quad PF,6+ Mos 07/30/2018  . Influenza-Unspecified 08/01/2013  . Pneumococcal Polysaccharide-23 04/05/2011  . Tdap 04/05/2011  . Zoster 11/06/2013   Last Pap smear: 12/2015, no high risk HPV present Last mammogram: 06/2018 Last colonoscopy: 05/2015; small tubular adenoma; repeat 5 years Last DEXA: never  Dentist: twice early  Ophtho: yearly  Exercise: Does [employer] program at work--Wednesday night chats with coaches; does video workouts 5x/week (stretching, core, HIIT,  strength training). Walks with dog 4-5x/week (is aerobic, dog doesn't stop)  Had HIV, RPR and Hepatitis screening done in 07/2005.  Husband hadHep C, completedtreatment and counts have been negative.  Past Medical History:  Diagnosis Date  . Adenomatous colon polyp 05/2015   tubular adenoma (Dr. Paulita Fujita); repeat 05/2020  . Allergy    seasonal  . Anxiety   . Asthma   .  Exposure to hepatitis C    husband  . Family hx of colon cancer    mom  . Pure hyperglyceridemia   . Unspecified vitamin D deficiency 05/2009    Past Surgical History:  Procedure Laterality Date  . CHOLECYSTECTOMY  84  . COLONOSCOPY  08/2009, 05/2015    Social History   Socioeconomic History  . Marital status: Married    Spouse name: Not on file  . Number of children: 0  . Years of education: Not on file  . Highest education level: Not on file  Occupational History  . Occupation: Biomedical engineer: [employer]  . 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 Financial resource strain: Not on file  . Food insecurity:    Worry: Not on file    Inability: Not on file  . Transportation needs:    Medical: Not on file    Non-medical: Not on file  Tobacco Use  . Smoking status: Never Smoker  . Smokeless tobacco: Never Used  Substance and Sexual Activity  . Alcohol use: Yes    Alcohol/week: 0.0 standard drinks    Comment: infrequent (2-3 drinks/month)  . Drug use: No  . Sexual activity: Yes    Partners: Male  Lifestyle  . Physical activity:    Days per week: Not on file    Minutes per session: Not on file  . Stress: Not on file  Relationships  . Social connections:    Talks on phone: Not on file    Gets together: Not on file    Attends religious service: Not on file    Active member of club or organization: Not on file    Attends meetings of clubs or organizations: Not on file    Relationship status: Not on file  . Intimate partner violence:    Fear of current or ex partner: Not on file    Emotionally abused: Not on file    Physically abused: Not on file    Forced sexual activity: Not on file  Other Topics Concern  . Not on file  Social History Narrative   Lives with her husband, 15 cats (2 are fosters) and 3 dogs. Husband smokes outside (recently restarted 2014).    Husband has Hepatitis C (treated in 2016; viral levels are zero per pt 2017)    Family History  Problem Relation Age of Onset  . Diabetes Mother   . Cancer Mother 49       colon cancer  . Colon cancer Mother 31  . Heart disease Father        CABG @69   . Hyperlipidemia Father   . Anxiety disorder Father   . Cancer Father        CLL  . Diabetes Sister   . Diabetes Sister   . Seizures Sister   . Heart disease Maternal Uncle        MI at 11 or 41  . Stroke Maternal Grandfather   .  Asthma Neg Hx     Outpatient Encounter Medications as of 01/20/2019  Medication Sig Note  . Ascorbic Acid (VITAMIN C) 1000 MG tablet Take 1,000 mg by mouth daily.   . budesonide-formoterol (SYMBICORT) 160-4.5 MCG/ACT inhaler Inhale 2 puffs into the lungs 2 (two) times daily. 01/20/2019: Currently using 2 puffs in the morning only; usually increases to BID when allergies flare or with illness  . montelukast (SINGULAIR) 10 MG tablet Take 1 tablet (10 mg total) by mouth at bedtime.   . sertraline (ZOLOFT) 100 MG tablet TAKE 1 BY MOUTH DAILY   . albuterol (PROVENTIL HFA;VENTOLIN HFA) 108 (90 BASE) MCG/ACT inhaler Inhale 1-2 puffs into the lungs every 6 (six) hours as needed for wheezing or shortness of breath. Reported on 01/05/2016 01/05/2016: Last needed in Fall  . aspirin 81 MG chewable tablet Chew 1 tablet (81 mg total) by mouth daily. (Patient not taking: Reported on 01/20/2019) 01/20/2019: Takes sporadically, forgets a lot  . fluticasone (FLONASE) 50 MCG/ACT nasal spray Place 2 sprays into both nostrils daily. (Patient not taking: Reported on 06/06/2018) 01/05/2016: Uses prn, not currently   No facility-administered encounter medications on file as of 01/20/2019.     Allergies  Allergen Reactions  . Erythromycin Nausea And Vomiting    ROS: The patient denies anorexia, fever, headaches, vision changes, ear pain, sore throat, breast concerns, chest pain, dizziness, syncope, dyspnea on exertion, swelling, nausea,  vomiting, diarrhea, abdominal pain, melena, hematochezia, hematuria, incontinence, dysuria, vaginal bleeding, discharge, odor or itch, genital lesions, numbness, weakness, tremor, suspicious skin lesions, depression, abnormal bleeding/bruising, or enlarged lymph nodes.  Some finger stiffness (4th and 5th fingers bilaterally), mild pain. Unchanged. Sometimes is forgetful ("little things"--names; no issues with appointments, bills) No longer having issues with constipation (rare) or insomnia. Worsening anxiety per HPI. Some hearing loss and tinnitus in the right ear   PHYSICAL EXAM:  BP 120/70   Pulse 72   Ht 5' 5.5" (1.664 m)   Wt 171 lb 3.2 oz (77.7 kg)   BMI 28.06 kg/m   Wt Readings from Last 3 Encounters:  01/20/19 171 lb 3.2 oz (77.7 kg)  07/30/18 173 lb 6.4 oz (78.7 kg)  06/06/18 178 lb 6.4 oz (80.9 kg)    General Appearance:  Alert, cooperative, no distress, appears stated age   Head:  Normocephalic, without obvious abnormality, atraumatic   Eyes:  PERRL, conjunctiva/corneas clear, EOM's intact, fundi benign   Ears:  Normal TM's and external ear canals  Nose:  Nares normal; nasal mucosa is mildly edematous, clear mucus on the left; sinuses nontender  Throat:  Lips, mucosa, and tongue normal; teeth and gums normal.   Neck:  Supple, no lymphadenopathy; thyroid: no enlargement/tenderness/ nodules; no carotid bruit or JVD   Back:  Spine nontender, no curvature, ROM normal, no CVA tenderness   Lungs:  Clear to auscultation bilaterally without rales or ronchi; respirations unlabored.  Chest Wall:  No tenderness or deformity   Heart:  Regular rate and rhythm, S1 and S2 normal, no murmur, rub or gallop   Breast Exam:  No tenderness, masses, or nipple discharge or inversion. No axillary lymphadenopathy   Abdomen:  Soft, non-tender, nondistended, normoactive bowel sounds, no masses, no hepatosplenomegaly   Genitalia:  Normal external  genitalia without lesions; very mild atrophic changes (mostly posteriorly at introitus). BUS and vagina normal; no cervical motion tenderness. No abnormal vaginal discharge. Uterus and adnexa not enlarged, nontender, no masses. Papnotperformed.   Rectal:  Normal tone, no masses or  tenderness; guaiac negative stool  Extremities:  No clubbing, cyanosis or edema.  Pulses:  2+ and symmetric all extremities   Skin:  Skin color, texture, turgor normal.  Lymph nodes:  Cervical, supraclavicular, and axillary nodes normal   Neurologic:  CNII-XII intact, normal strength, sensation and gait; reflexes 2+ and symmetric throughout(mildly hyper-reflexic at knees bilaterally)  Psych: Normal mood, affect, hygiene and grooming   GAD-7 score of 5 Spirometry--normal   ASSESSMENT/PLAN:  Annual physical exam - Plan: CBC with Differential/Platelet, VITAMIN D 25 Hydroxy (Vit-D Deficiency, Fractures), Comprehensive metabolic panel, Lipid panel  Vitamin D deficiency - continue 2000 IU daily - Plan: VITAMIN D 25 Hydroxy (Vit-D Deficiency, Fractures)  Impaired fasting glucose - continue to limit sweets, sugar, carbs and get regular exercise - Plan: Comprehensive metabolic panel  Asthma in remission - continue symbicort--increase to BID as allergy season starts - Plan: albuterol (PROVENTIL HFA;VENTOLIN HFA) 108 (90 Base) MCG/ACT inhaler, montelukast (SINGULAIR) 10 MG tablet, budesonide-formoterol (SYMBICORT) 160-4.5 MCG/ACT inhaler, Spirometry with Graph  Generalized anxiety disorder - worsening control; increase the sertraline to 1.5 tablets daily. risks/SE reviewed.  f/u 6 weeks - Plan: sertraline (ZOLOFT) 100 MG tablet  Hearing loss of right ear, unspecified hearing loss type - recommend audiologist evaluation; normal exam  Seasonal allergic rhinitis, unspecified trigger - restart flonase; continue singulair  Counseled re: restarting flonase. Refilled albuterol  for prn use. Counseled re: techniques for anxiety, relaxation, stress reduction. Risks/SE of meds reviewed Total OV time spent was 55 min, significant counseling outside of preventative exam.   Discussed monthly self breast exams and yearly mammograms; at least 30 minutes of aerobic activity at least 5 days/week ,weight-bearing exercise 2x/wk; proper sunscreen use reviewed; healthy diet, including goals of calcium and vitamin D intake and alcohol recommendations (less than or equal to 1 drink/day) reviewed; regular seatbelt use; changing batteries in smoke detectors. Immunization recommendations discussed--continue yearly flu shots. Shingrix recommended, risks/side effects reviewed (to check with insurance and schedule NV when convenient).Colonoscopy recommendations reviewed--UTD, due again 05/2020. Pap UTD, due next 2022.    F/u 6 weeks on anxiety, 1 year for CPE

## 2019-01-19 NOTE — Patient Instructions (Addendum)
  HEALTH MAINTENANCE RECOMMENDATIONS:  It is recommended that you get at least 30 minutes of aerobic exercise at least 5 days/week (for weight loss, you may need as much as 60-90 minutes). This can be any activity that gets your heart rate up. This can be divided in 10-15 minute intervals if needed, but try and build up your endurance at least once a week.  Weight bearing exercise is also recommended twice weekly.  Eat a healthy diet with lots of vegetables, fruits and fiber.  "Colorful" foods have a lot of vitamins (ie green vegetables, tomatoes, red peppers, etc).  Limit sweet tea, regular sodas and alcoholic beverages, all of which has a lot of calories and sugar.  Up to 1 alcoholic drink daily may be beneficial for women (unless trying to lose weight, watch sugars).  Drink a lot of water.  Calcium recommendations are 1200-1500 mg daily (1500 mg for postmenopausal women or women without ovaries), and vitamin D 1000 IU daily.  This should be obtained from diet and/or supplements (vitamins), and calcium should not be taken all at once, but in divided doses.  Monthly self breast exams and yearly mammograms for women over the age of 53 is recommended.  Sunscreen of at least SPF 30 should be used on all sun-exposed parts of the skin when outside between the hours of 10 am and 4 pm (not just when at beach or pool, but even with exercise, golf, tennis, and yard work!)  Use a sunscreen that says "broad spectrum" so it covers both UVA and UVB rays, and make sure to reapply every 1-2 hours.  Remember to change the batteries in your smoke detectors when changing your clock times in the spring and fall.  Carbon monoxide detectors are recommended for your home.  Use your seat belt every time you are in a car, and please drive safely and not be distracted with cell phones and texting while driving.  [facility] and [facility] are two audiology groups in town that I've had good experiences with.  I  recommend getting the new shingles vaccine (Shingrix). You will need to check with your insurance to see if it is covered, and if covered, schedule a nurse visit with our office.  It is a series of 2 injections, spaced 2 months apart.

## 2019-01-20 ENCOUNTER — Ambulatory Visit (INDEPENDENT_AMBULATORY_CARE_PROVIDER_SITE_OTHER): Payer: 59 | Admitting: Family Medicine

## 2019-01-20 ENCOUNTER — Other Ambulatory Visit: Payer: Self-pay

## 2019-01-20 ENCOUNTER — Encounter: Payer: Self-pay | Admitting: Family Medicine

## 2019-01-20 VITALS — BP 120/70 | HR 72 | Ht 65.5 in | Wt 171.2 lb

## 2019-01-20 DIAGNOSIS — Z0000 Encounter for general adult medical examination without abnormal findings: Secondary | ICD-10-CM

## 2019-01-20 DIAGNOSIS — J302 Other seasonal allergic rhinitis: Secondary | ICD-10-CM

## 2019-01-20 DIAGNOSIS — E559 Vitamin D deficiency, unspecified: Secondary | ICD-10-CM | POA: Diagnosis not present

## 2019-01-20 DIAGNOSIS — R7301 Impaired fasting glucose: Secondary | ICD-10-CM | POA: Diagnosis not present

## 2019-01-20 DIAGNOSIS — J45998 Other asthma: Secondary | ICD-10-CM

## 2019-01-20 DIAGNOSIS — F411 Generalized anxiety disorder: Secondary | ICD-10-CM

## 2019-01-20 DIAGNOSIS — H9191 Unspecified hearing loss, right ear: Secondary | ICD-10-CM

## 2019-01-20 MED ORDER — ALBUTEROL SULFATE HFA 108 (90 BASE) MCG/ACT IN AERS: 1.0000 | INHALATION_SPRAY | Freq: Four times a day (QID) | RESPIRATORY_TRACT | 1 refills | Status: DC | PRN

## 2019-01-20 MED ORDER — MONTELUKAST SODIUM 10 MG PO TABS: 10.0000 mg | ORAL_TABLET | Freq: Every day | ORAL | 3 refills | Status: DC

## 2019-01-20 MED ORDER — BUDESONIDE-FORMOTEROL FUMARATE 160-4.5 MCG/ACT IN AERO: 2.0000 | INHALATION_SPRAY | Freq: Two times a day (BID) | RESPIRATORY_TRACT | 3 refills | Status: DC

## 2019-01-20 MED ORDER — SERTRALINE HCL 100 MG PO TABS: 150.0000 mg | ORAL_TABLET | Freq: Every day | ORAL | 0 refills | Status: DC

## 2019-01-21 LAB — COMPREHENSIVE METABOLIC PANEL
A/G RATIO: 2.4 — AB (ref 1.2–2.2)
ALT: 26 IU/L (ref 0–32)
AST: 20 IU/L (ref 0–40)
Albumin: 4.7 g/dL (ref 3.8–4.9)
Alkaline Phosphatase: 91 IU/L (ref 39–117)
BUN/Creatinine Ratio: 17 (ref 9–23)
BUN: 14 mg/dL (ref 6–24)
Bilirubin Total: 0.4 mg/dL (ref 0.0–1.2)
CALCIUM: 9.6 mg/dL (ref 8.7–10.2)
CO2: 22 mmol/L (ref 20–29)
CREATININE: 0.83 mg/dL (ref 0.57–1.00)
Chloride: 100 mmol/L (ref 96–106)
GFR, EST AFRICAN AMERICAN: 91 mL/min/{1.73_m2} (ref >59)
GFR, EST NON AFRICAN AMERICAN: 79 mL/min/{1.73_m2} (ref >59)
GLOBULIN, TOTAL: 2 g/dL (ref 1.5–4.5)
GLUCOSE: 94 mg/dL (ref 65–99)
POTASSIUM: 4.8 mmol/L (ref 3.5–5.2)
SODIUM: 139 mmol/L (ref 134–144)
TOTAL PROTEIN: 6.7 g/dL (ref 6.0–8.5)

## 2019-01-21 LAB — CBC WITH DIFFERENTIAL/PLATELET
Basophils Absolute: 0.1 10*3/uL (ref 0.0–0.2)
Basos: 1 %
EOS (ABSOLUTE): 0.3 10*3/uL (ref 0.0–0.4)
EOS: 5 %
HEMATOCRIT: 40.3 % (ref 34.0–46.6)
HEMOGLOBIN: 14.2 g/dL (ref 11.1–15.9)
IMMATURE GRANS (ABS): 0 10*3/uL (ref 0.0–0.1)
IMMATURE GRANULOCYTES: 0 %
LYMPHS ABS: 1.3 10*3/uL (ref 0.7–3.1)
Lymphs: 25 %
MCH: 31.4 pg (ref 26.6–33.0)
MCHC: 35.2 g/dL (ref 31.5–35.7)
MCV: 89 fL (ref 79–97)
Monocytes Absolute: 0.4 10*3/uL (ref 0.1–0.9)
Monocytes: 7 %
NEUTROS PCT: 62 %
Neutrophils Absolute: 3.2 10*3/uL (ref 1.4–7.0)
Platelets: 236 10*3/uL (ref 150–450)
RBC: 4.52 x10E6/uL (ref 3.77–5.28)
RDW: 12.2 % (ref 11.7–15.4)
WBC: 5.2 10*3/uL (ref 3.4–10.8)

## 2019-01-21 LAB — LIPID PANEL
CHOL/HDL RATIO: 3.1 ratio (ref 0.0–4.4)
Cholesterol, Total: 209 mg/dL — ABNORMAL HIGH (ref 100–199)
HDL: 67 mg/dL (ref >39)
LDL CALC: 124 mg/dL — AB (ref 0–99)
Triglycerides: 88 mg/dL (ref 0–149)
VLDL Cholesterol Cal: 18 mg/dL (ref 5–40)

## 2019-01-21 LAB — VITAMIN D 25 HYDROXY (VIT D DEFICIENCY, FRACTURES): Vit D, 25-Hydroxy: 31.1 ng/mL (ref 30.0–100.0)

## 2019-01-28 DIAGNOSIS — M9903 Segmental and somatic dysfunction of lumbar region: Secondary | ICD-10-CM | POA: Diagnosis not present

## 2019-01-28 DIAGNOSIS — M9905 Segmental and somatic dysfunction of pelvic region: Secondary | ICD-10-CM | POA: Diagnosis not present

## 2019-01-28 DIAGNOSIS — M9904 Segmental and somatic dysfunction of sacral region: Secondary | ICD-10-CM | POA: Diagnosis not present

## 2019-02-03 DIAGNOSIS — M9905 Segmental and somatic dysfunction of pelvic region: Secondary | ICD-10-CM | POA: Diagnosis not present

## 2019-02-03 DIAGNOSIS — M9904 Segmental and somatic dysfunction of sacral region: Secondary | ICD-10-CM | POA: Diagnosis not present

## 2019-02-03 DIAGNOSIS — M9903 Segmental and somatic dysfunction of lumbar region: Secondary | ICD-10-CM | POA: Diagnosis not present

## 2019-02-10 DIAGNOSIS — M9905 Segmental and somatic dysfunction of pelvic region: Secondary | ICD-10-CM | POA: Diagnosis not present

## 2019-02-10 DIAGNOSIS — M9903 Segmental and somatic dysfunction of lumbar region: Secondary | ICD-10-CM | POA: Diagnosis not present

## 2019-02-10 DIAGNOSIS — M9904 Segmental and somatic dysfunction of sacral region: Secondary | ICD-10-CM | POA: Diagnosis not present

## 2019-02-17 DIAGNOSIS — M9905 Segmental and somatic dysfunction of pelvic region: Secondary | ICD-10-CM | POA: Diagnosis not present

## 2019-02-17 DIAGNOSIS — M9903 Segmental and somatic dysfunction of lumbar region: Secondary | ICD-10-CM | POA: Diagnosis not present

## 2019-02-17 DIAGNOSIS — M9904 Segmental and somatic dysfunction of sacral region: Secondary | ICD-10-CM | POA: Diagnosis not present

## 2019-03-03 DIAGNOSIS — M9903 Segmental and somatic dysfunction of lumbar region: Secondary | ICD-10-CM | POA: Diagnosis not present

## 2019-03-03 DIAGNOSIS — M9905 Segmental and somatic dysfunction of pelvic region: Secondary | ICD-10-CM | POA: Diagnosis not present

## 2019-03-03 DIAGNOSIS — M9904 Segmental and somatic dysfunction of sacral region: Secondary | ICD-10-CM | POA: Diagnosis not present

## 2019-03-06 ENCOUNTER — Encounter: Payer: 59 | Admitting: Family Medicine

## 2019-03-18 NOTE — Progress Notes (Signed)
Start time: 11:48 End time: 12:10  Virtual Visit via Video Note  I connected with Hannah Werner on 03/20/2019 by a video enabled telemedicine application and verified that I am speaking with the correct person using two identifiers.  Location: Patient: in her car (not driving), alone. Provider: home office   I discussed the limitations of evaluation and management by telemedicine and the availability of in person appointments. The patient expressed understanding and agreed to proceed.  History of Present Illness:  Chief Complaint  Patient presents with  . Anxiety    med check. No new concerns.     At her physical in March, it was noted that her anxiety had been worse; she didn't feel as though the medication was as effective as it had been.  She described that in the last couple of months (prior to her physical) she has noticed increased anxiety--feels like she is coming out of her skin.  Feels tense, clenching.  Denies mind racing or having any trouble falling asleep.  Similar to what she felt prior to her hot flashes (which she no longer gets).  Occurs at least once or twice daily.  They are short-lived, a few minutes, resolves with some relaxation techniques/breathing. Happens more at night, when trying to fall asleep.  No trouble falling or staying asleep.  Denies any full panic attack.  Her sertraline dose was increased from 100mg  to 150mg  at that time. Today's visit is to follow-up on her anxiety.  She is taking the higher dose of sertraline without side effects. She started using CBD oil at night, which has helped a lot.  It is inhaled via a pen (not aerosolized or vape). Denies any effects on her lungs, using for a month. Thinks it has been helpful. She has also been limiting her news watching, which has also helped decrease her anxiety.  She has been setting some parameters about working from home (to try and reduce stress)--struggled some to find balance when working at  home. Continues to exercise regularly.  PMH, PSH, SH reviewed  Outpatient Encounter Medications as of 03/20/2019  Medication Sig Note  . Ascorbic Acid (VITAMIN C) 1000 MG tablet Take 1,000 mg by mouth daily.   . budesonide-formoterol (SYMBICORT) 160-4.5 MCG/ACT inhaler Inhale 2 puffs into the lungs 2 (two) times daily.   . montelukast (SINGULAIR) 10 MG tablet Take 1 tablet (10 mg total) by mouth at bedtime.   . sertraline (ZOLOFT) 100 MG tablet Take 1.5 tablets (150 mg total) by mouth at bedtime. TAKE 1 BY MOUTH DAILY   . albuterol (PROVENTIL HFA;VENTOLIN HFA) 108 (90 Base) MCG/ACT inhaler Inhale 1-2 puffs into the lungs every 6 (six) hours as needed for wheezing or shortness of breath. Reported on 01/05/2016 (Patient not taking: Reported on 03/20/2019)   . aspirin 81 MG chewable tablet Chew 1 tablet (81 mg total) by mouth daily. (Patient not taking: Reported on 01/20/2019) 01/20/2019: Takes sporadically, forgets a lot  . fluticasone (FLONASE) 50 MCG/ACT nasal spray Place 2 sprays into both nostrils daily. (Patient not taking: Reported on 06/06/2018) 01/05/2016: Uses prn, not currently   No facility-administered encounter medications on file as of 03/20/2019.    Allergies  Allergen Reactions  . Erythromycin Nausea And Vomiting   ROS: Denies headaches, GI symptoms, URI symptoms. Breathing has remained good, no shortness of breath.  Moods improved.  See HPI.   Observations/Objective:  Ht 5' 5.5" (1.664 m)   Wt 169 lb (76.7 kg)   BMI 27.70 kg/m  Wt Readings from Last 3 Encounters:  03/20/19 169 lb (76.7 kg)  01/20/19 171 lb 3.2 oz (77.7 kg)  07/30/18 173 lb 6.4 oz (78.7 kg)    Exam is limited due to virtual nature of the visit. She is alert, oriented, in good spirits. Cranial nerves grossly intact. Normal eye contact, speech, grooming.  GAD-7 score of 3 (score was 5 at CPE in March)  Assessment and Plan:  Generalized anxiety disorder - improving. continue 150mg  of sertraline.  I didn't  rec inhaled CBD; discussed other methods, essential oils,    Follow Up Instructions:    I discussed the assessment and treatment plan with the patient. The patient was provided an opportunity to ask questions and all were answered. The patient agreed with the plan and demonstrated an understanding of the instructions.   The patient was advised to call back or seek an in-person evaluation if the symptoms worsen or if the condition fails to improve as anticipated.  I provided 22 minutes of non-face-to-face time during this encounter.   Vikki Ports, MD

## 2019-03-20 ENCOUNTER — Encounter: Payer: Self-pay | Admitting: Family Medicine

## 2019-03-20 ENCOUNTER — Ambulatory Visit (INDEPENDENT_AMBULATORY_CARE_PROVIDER_SITE_OTHER): Payer: 59 | Admitting: Family Medicine

## 2019-03-20 ENCOUNTER — Other Ambulatory Visit: Payer: Self-pay

## 2019-03-20 VITALS — Ht 65.5 in | Wt 169.0 lb

## 2019-03-20 DIAGNOSIS — F411 Generalized anxiety disorder: Secondary | ICD-10-CM

## 2019-03-20 NOTE — Patient Instructions (Signed)
I'm glad that your anxiety seems to be improved--partly related to many changes you made (good job!--limiting TV, recognizing the need for some parameters at home for work-life balance, continuing regular exercise, reaching out to others, etc).  Continue the 1.5 tablets (150mg ) of sertraline, and contact your pharmacy for refill when needed next month. When things are more normal, and you feel ready, you can consider trying to decrease your dose back to 1 tablet daily . If you notice increase in tension/stress/irritability recurring, go back up to the 150mg  dose.  There is no need to rush to decrease the dose, and it would be perfectly fine to remain on the 150mg  if life's increased stress continues.  Stay well!

## 2019-06-19 ENCOUNTER — Other Ambulatory Visit: Payer: Self-pay | Admitting: Family Medicine

## 2019-06-19 DIAGNOSIS — F411 Generalized anxiety disorder: Secondary | ICD-10-CM

## 2019-08-06 LAB — HM MAMMOGRAPHY

## 2019-08-11 ENCOUNTER — Encounter: Payer: Self-pay | Admitting: *Deleted

## 2019-11-17 ENCOUNTER — Other Ambulatory Visit: Payer: Self-pay | Admitting: Family Medicine

## 2019-11-17 DIAGNOSIS — F411 Generalized anxiety disorder: Secondary | ICD-10-CM

## 2020-01-24 NOTE — Patient Instructions (Addendum)
  HEALTH MAINTENANCE RECOMMENDATIONS:  It is recommended that you get at least 30 minutes of aerobic exercise at least 5 days/week (for weight loss, you may need as much as 60-90 minutes). This can be any activity that gets your heart rate up. This can be divided in 10-15 minute intervals if needed, but try and build up your endurance at least once a week.  Weight bearing exercise is also recommended twice weekly.  Eat a healthy diet with lots of vegetables, fruits and fiber.  "Colorful" foods have a lot of vitamins (ie green vegetables, tomatoes, red peppers, etc).  Limit sweet tea, regular sodas and alcoholic beverages, all of which has a lot of calories and sugar.  Up to 1 alcoholic drink daily may be beneficial for women (unless trying to lose weight, watch sugars).  Drink a lot of water.  Calcium recommendations are 1200-1500 mg daily (1500 mg for postmenopausal women or women without ovaries), and vitamin D 1000 IU daily.  This should be obtained from diet and/or supplements (vitamins), and calcium should not be taken all at once, but in divided doses.  Monthly self breast exams and yearly mammograms for women over the age of 53 is recommended.  Sunscreen of at least SPF 30 should be used on all sun-exposed parts of the skin when outside between the hours of 10 am and 4 pm (not just when at beach or pool, but even with exercise, golf, tennis, and yard work!)  Use a sunscreen that says "broad spectrum" so it covers both UVA and UVB rays, and make sure to reapply every 1-2 hours.  Remember to change the batteries in your smoke detectors when changing your clock times in the spring and fall. Carbon monoxide detectors are recommended for your home.  Use your seat belt every time you are in a car, and please drive safely and not be distracted with cell phones and texting while driving.  Pap is due again next year. Colonoscopy is due again in August.  They should be reaching out to contact you to  schedule.  I recommend getting the new shingles vaccine (Shingrix). You will need to check with your insurance to see if it is covered, and if covered, schedule at our office when convenient. You must wait 4 weeks after the 2nd COVID vaccine before getting Shingrix.  It is a series of 2 injections, spaced 2 months apart.  COVID vaccine is recommended.  Tetanus booster will be due next year. Continue yearly flu shots.  Look at [employer] or [facility] (on [address]) for hearing evaluation.

## 2020-01-24 NOTE — Progress Notes (Signed)
Chief Complaint  Patient presents with  . Annual Exam    fasting annual exam with pelvic. Did not yet do hearing eval due to COVID, will schedule. Is going to get COVID vaccines through her job w/[employer] (on site clinic event). Had flu shot in Oct. No concerns.     Hannah Werner is a 59 y.o. female who presents for a complete physical and follow-up on chronic problems.   She will be getting COVID vaccine through work.  Anxiety:  Her dose of sertraline was increased from 118m to 154mat her physical last year.  She is tolerating this dose without side effects, and anxiety has improved. At her last visit she had also been using CBD oil inhaled via a pen. She now uses a roll-on CBD oil instead. Prior to the dose change she had reported feeling tense, clenching. occuring at least once or twice daily, more at night, when trying to fall asleep. She had also been drinking more alcohol. She reports she continues to do well with her anxiety.  She cut back on alcohol use, limits her news watching (which still makes her irritable), and continues to get regular exercise.  Pre-diabetes/IFG: Fasting glucose was 109 at her physical in March 2019. She continues to get regular exercise, watches her carbs. No sugar-sweetened beverages. Fasting sugar was 92 in October 2019 and 94 in 12/2018. Lab Results  Component Value Date   HGBA1C 5.6 07/30/2018   Asthma and allergies: Doing well overall.Breathing is good--hasn't needed to use a rescue in a long time (can't even recall, maybe in the winter).Uses Symbicort just in the morning (in the afternoon just prn (not recently), usually during allergy season and when sick).Takes montelukast at night.Spirometry was normal in 12/2018.  Eyes started feeling itchy this morning. Hasn't started Flonase yet, or used an antihistamine (has one at home). Recalls very mild illness (?bronchitis, had cough) in August, which resolved on its own, used inhaler.  Never evaluated,  only felt "not so good" for 1 day.   High cholesterol: Lipids were high in 12/2014 when eating a lot of bacon at work. She improved her diet, and rechecks have beenbetter. She continues on lowfat, low cholesterol diet. She admits to a little more red meat and bacon, tries to limit to once a week (husband cooking more while at home over the pandemic).Last check was 12/2018:  Lab Results  Component Value Date   CHOL 209 (H) 01/20/2019   HDL 67 01/20/2019   LDLCALC 124 (H) 01/20/2019   TRIG 88 01/20/2019   CHOLHDL 3.1 01/20/2019   H/o Vitamin D deficiency. Last level was 31.1 in 12/2018, when taking 2000 IU, only missing a dose infrequently.  Prior level was 27.6 in October 2019 when forgetting to take it frequently.  She is currently taking 2000 IU, taking it pretty consistently.   Immunization History  Administered Date(s) Administered  . DT (Pediatric) 04/19/2000  . Influenza Split 08/14/2011, 07/30/2014  . Influenza, Seasonal, Injecte, Preservative Fre 12/23/2012  . Influenza,inj,Quad PF,6+ Mos 07/30/2018  . Influenza-Unspecified 08/01/2013, 08/29/2019  . Pneumococcal Polysaccharide-23 04/05/2011  . Tdap 04/05/2011  . Zoster 11/06/2013   Last Pap smear: 12/2015, no high risk HPV present Last mammogram: 07/2019 Last colonoscopy: 05/2015; small tubular adenoma; repeat 5 years Last DEXA: never  Dentist: twice early  Ophtho: yearly  Exercise: She had gotten out of the habit of the video workouts, noted weight creeping back up, but is now back on track.  Changed exercise to  mornings.  She does video workouts 5x/week (stretching, core, HIIT, strength training). Walks with dog 3-4x/week (is aerobic, dog doesn't stop)  Had HIV, RPR and Hepatitis screening done in 07/2005.  Husband hadHep C, completedtreatment and counts have been negative. She states it never came back. Last Hep C screening was 2016.  PMH, PSH, SH and FH reviewed and updated  Outpatient Encounter  Medications as of 01/26/2020  Medication Sig Note  . Ascorbic Acid (VITAMIN C) 1000 MG tablet Take 1,000 mg by mouth daily.   . budesonide-formoterol (SYMBICORT) 160-4.5 MCG/ACT inhaler Inhale 2 puffs into the lungs 2 (two) times daily.   . cholecalciferol (VITAMIN D3) 25 MCG (1000 UNIT) tablet Take 2,000 Units by mouth daily.   . montelukast (SINGULAIR) 10 MG tablet Take 1 tablet (10 mg total) by mouth at bedtime.   . sertraline (ZOLOFT) 100 MG tablet TAKE 1 AND 1/2 TABLETS BY MOUTH DAILY ATBEDTIME   . albuterol (PROVENTIL HFA;VENTOLIN HFA) 108 (90 Base) MCG/ACT inhaler Inhale 1-2 puffs into the lungs every 6 (six) hours as needed for wheezing or shortness of breath. Reported on 01/05/2016 (Patient not taking: Reported on 03/20/2019)   . aspirin 81 MG chewable tablet Chew 1 tablet (81 mg total) by mouth daily. (Patient not taking: Reported on 01/20/2019) 01/20/2019: Takes sporadically, forgets a lot  . fluticasone (FLONASE) 50 MCG/ACT nasal spray Place 2 sprays into both nostrils daily. (Patient not taking: Reported on 06/06/2018) 01/05/2016: Uses prn, not currently   No facility-administered encounter medications on file as of 01/26/2020.   Also taking elderberry and vit C, turmeric.  Allergies  Allergen Reactions  . Erythromycin Nausea And Vomiting    ROS: The patient denies anorexia, fever, headaches, vision changes, ear pain, sore throat, breast concerns, chest pain, dizziness, syncope, dyspnea on exertion, swelling, nausea, vomiting, diarrhea, abdominal pain, melena, hematochezia, hematuria, incontinence, dysuria, vaginal bleeding, discharge, odor or itch, genital lesions, numbness, weakness, tremor, suspicious skin lesions, depression, abnormal bleeding/bruising, or enlarged lymph nodes.  Some finger stiffness (4th and 5th fingers bilaterally), mild pain. Unchanged. Sometimes is forgetful ("little things"--names; no issues with appointments, bills), unchanged per pt. Anxiety per HPI,  improved. Some hearing loss and tinnitus in the right ear. Unchanged from last year and plans to get checked. Had recently regained some weight, but lost 4# with better exercise routine and diet recently.  PHYSICAL EXAM:  BP 110/80   Pulse 88   Temp (!) 96.8 F (36 C) (Other (Comment))   Ht 5' 5.5" (1.664 m)   Wt 173 lb 3.2 oz (78.6 kg)   BMI 28.38 kg/m   Wt Readings from Last 3 Encounters:  01/26/20 173 lb 3.2 oz (78.6 kg)  03/20/19 169 lb (76.7 kg)  01/20/19 171 lb 3.2 oz (77.7 kg)    General Appearance:  Alert, cooperative, no distress, appears stated age   Head:  Normocephalic, without obvious abnormality, atraumatic   Eyes:  PERRL, conjunctiva/corneas clear, EOM's intact, fundi benign   Ears:  Normal TM's and external ear canals  Nose:  Not examined, wearing mask due to COVID-19 pandemic  Throat:  Not examined, wearing mask due to COVID-19 pandemic.   Neck:  Supple, no lymphadenopathy; thyroid: no enlargement/tenderness/ nodules; no carotid bruit or JVD   Back:  Spine nontender, no curvature, ROM normal, no CVA tenderness   Lungs:  Clear to auscultation bilaterally without rales or ronchi; respirations unlabored.  Chest Wall:  No tenderness or deformity   Heart:  Regular rate and rhythm, S1  and S2 normal, no murmur, rub or gallop   Breast Exam:  No tenderness, masses, or nipple discharge or inversion. No axillary lymphadenopathy   Abdomen:  Soft, non-tender, nondistended, normoactive bowel sounds, no masses, no hepatosplenomegaly   Genitalia:  Normal external genitalia without lesions; very mild atrophic changes. BUS and vagina normal; no cervical motion tenderness. No abnormal vaginal discharge. Uterus and adnexa not enlarged, nontender, no masses. Papnotperformed.   Rectal:  Normal tone, no masses or tenderness; guaiac negative stool  Extremities:  No clubbing, cyanosis or edema.  Pulses:  2+ and symmetric all  extremities   Skin:  Skin color, texture, turgor normal.  Lymph nodes:  Cervical, supraclavicular, and axillary nodes normal   Neurologic:  Normal strength, sensation and gait; reflexes 2+ and symmetric throughout(mildly hyper-reflexic at knees bilaterally)  Psych:  Normal mood, affect, hygiene and grooming   GAD-7 score of 2    ASSESSMENT/PLAN:  Annual physical exam - Plan: CBC with Differential/Platelet, Comprehensive metabolic panel, Lipid panel  Vitamin D deficiency - continue current supplements  Impaired fasting glucose - continue daily exercise, limiting sweets, carbs in diet, weight loww - Plan: Comprehensive metabolic panel  Asthma in remission - continue  montelukast, symbicort--increase to BID as allergy season starts - Plan: budesonide-formoterol (SYMBICORT) 160-4.5 MCG/ACT inhaler, montelukast (SINGULAIR) 10 MG tablet  Generalized anxiety disorder - well controlled on sertraline 147m daily, continue - Plan: sertraline (ZOLOFT) 100 MG tablet  Cbc, c-met, lipids  Discussed monthly self breast exams and yearly mammograms; at least 30 minutes of aerobic activity at least 5 days/week ,weight-bearing exercise 2x/wk; proper sunscreen use reviewed; healthy diet, including goals of calcium and vitamin D intake and alcohol recommendations (less than or equal to 1 drink/day) reviewed; regular seatbelt use; changing batteries in smoke detectors. Immunization recommendations discussed--continue yearly flu shots. Shingrix recommended, risks/side effects reviewed (to check with insurance and schedule [place] when convenient, discussed timing around/after COVID vaccine, which I recommend she get first).Tetanus booster next year. Colonoscopy recommendations reviewed--due again 05/2020. Pap UTD, due next year.  F/u 1 year, sooner prn

## 2020-01-26 ENCOUNTER — Encounter: Payer: Self-pay | Admitting: Family Medicine

## 2020-01-26 ENCOUNTER — Other Ambulatory Visit: Payer: Self-pay

## 2020-01-26 ENCOUNTER — Ambulatory Visit (INDEPENDENT_AMBULATORY_CARE_PROVIDER_SITE_OTHER): Payer: 59 | Admitting: Family Medicine

## 2020-01-26 VITALS — BP 110/80 | HR 88 | Temp 96.8°F | Ht 65.5 in | Wt 173.2 lb

## 2020-01-26 DIAGNOSIS — E559 Vitamin D deficiency, unspecified: Secondary | ICD-10-CM | POA: Diagnosis not present

## 2020-01-26 DIAGNOSIS — R7301 Impaired fasting glucose: Secondary | ICD-10-CM

## 2020-01-26 DIAGNOSIS — J45998 Other asthma: Secondary | ICD-10-CM | POA: Diagnosis not present

## 2020-01-26 DIAGNOSIS — Z0000 Encounter for general adult medical examination without abnormal findings: Secondary | ICD-10-CM

## 2020-01-26 DIAGNOSIS — F411 Generalized anxiety disorder: Secondary | ICD-10-CM

## 2020-01-26 MED ORDER — MONTELUKAST SODIUM 10 MG PO TABS: 10.0000 mg | ORAL_TABLET | Freq: Every day | ORAL | 3 refills | Status: DC

## 2020-01-26 MED ORDER — BUDESONIDE-FORMOTEROL FUMARATE 160-4.5 MCG/ACT IN AERO: 2.0000 | INHALATION_SPRAY | Freq: Two times a day (BID) | RESPIRATORY_TRACT | 3 refills | Status: DC

## 2020-01-26 MED ORDER — SERTRALINE HCL 100 MG PO TABS: ORAL_TABLET | ORAL | 3 refills | Status: DC

## 2020-01-27 LAB — CBC WITH DIFFERENTIAL/PLATELET
Basophils Absolute: 0.1 10*3/uL (ref 0.0–0.2)
Basos: 1 %
EOS (ABSOLUTE): 0.4 10*3/uL (ref 0.0–0.4)
Eos: 8 %
Hematocrit: 42.7 % (ref 34.0–46.6)
Hemoglobin: 14.6 g/dL (ref 11.1–15.9)
Immature Grans (Abs): 0 10*3/uL (ref 0.0–0.1)
Immature Granulocytes: 0 %
Lymphocytes Absolute: 1.3 10*3/uL (ref 0.7–3.1)
Lymphs: 25 %
MCH: 31.8 pg (ref 26.6–33.0)
MCHC: 34.2 g/dL (ref 31.5–35.7)
MCV: 93 fL (ref 79–97)
Monocytes Absolute: 0.4 10*3/uL (ref 0.1–0.9)
Monocytes: 6 %
Neutrophils Absolute: 3.3 10*3/uL (ref 1.4–7.0)
Neutrophils: 60 %
Platelets: 252 10*3/uL (ref 150–450)
RBC: 4.59 x10E6/uL (ref 3.77–5.28)
RDW: 12.2 % (ref 11.7–15.4)
WBC: 5.4 10*3/uL (ref 3.4–10.8)

## 2020-01-27 LAB — LIPID PANEL
Chol/HDL Ratio: 3 ratio (ref 0.0–4.4)
Cholesterol, Total: 212 mg/dL — ABNORMAL HIGH (ref 100–199)
HDL: 71 mg/dL (ref >39)
LDL Chol Calc (NIH): 119 mg/dL — ABNORMAL HIGH (ref 0–99)
Triglycerides: 124 mg/dL (ref 0–149)
VLDL Cholesterol Cal: 22 mg/dL (ref 5–40)

## 2020-01-27 LAB — COMPREHENSIVE METABOLIC PANEL
ALT: 16 IU/L (ref 0–32)
AST: 18 IU/L (ref 0–40)
Albumin/Globulin Ratio: 2.1 (ref 1.2–2.2)
Albumin: 4.7 g/dL (ref 3.8–4.9)
Alkaline Phosphatase: 104 IU/L (ref 39–117)
BUN/Creatinine Ratio: 15 (ref 9–23)
BUN: 12 mg/dL (ref 6–24)
Bilirubin Total: 0.5 mg/dL (ref 0.0–1.2)
CO2: 23 mmol/L (ref 20–29)
Calcium: 10.1 mg/dL (ref 8.7–10.2)
Chloride: 104 mmol/L (ref 96–106)
Creatinine, Ser: 0.78 mg/dL (ref 0.57–1.00)
GFR calc Af Amer: 97 mL/min/{1.73_m2} (ref >59)
GFR calc non Af Amer: 84 mL/min/{1.73_m2} (ref >59)
Globulin, Total: 2.2 g/dL (ref 1.5–4.5)
Glucose: 103 mg/dL — ABNORMAL HIGH (ref 65–99)
Potassium: 4.9 mmol/L (ref 3.5–5.2)
Sodium: 142 mmol/L (ref 134–144)
Total Protein: 6.9 g/dL (ref 6.0–8.5)

## 2020-03-19 ENCOUNTER — Telehealth: Payer: Self-pay

## 2020-03-19 DIAGNOSIS — J45998 Other asthma: Secondary | ICD-10-CM

## 2020-03-19 MED ORDER — BREO ELLIPTA 200-25 MCG/INH IN AEPB: 1.0000 | INHALATION_SPRAY | Freq: Every day | RESPIRATORY_TRACT | 5 refills | Status: DC

## 2020-03-19 NOTE — Telephone Encounter (Signed)
I submitted a PA for the pts. Symbicort and it was denied by the insurance and the fax stated that the medication is excluded from the pts. Plan. The covered alternatives are Flovent HFA, Breo Ellipta, Advair HFA, Trelegy Ellipta, and Anoro Ellipta.

## 2020-03-19 NOTE — Telephone Encounter (Signed)
Changed to [facility]; pt notified via MyChart message of the change

## 2020-03-22 NOTE — Telephone Encounter (Signed)
Patient advised.

## 2020-03-22 NOTE — Telephone Encounter (Signed)
Pt hasn't read MyChart message regarding change in med.  Please advise

## 2020-08-11 LAB — HM MAMMOGRAPHY

## 2020-08-18 ENCOUNTER — Encounter: Payer: Self-pay | Admitting: Family Medicine

## 2021-01-26 NOTE — Patient Instructions (Addendum)
  HEALTH MAINTENANCE RECOMMENDATIONS:  It is recommended that you get at least 30 minutes of aerobic exercise at least 5 days/week (for weight loss, you may need as much as 60-90 minutes). This can be any activity that gets your heart rate up. This can be divided in 10-15 minute intervals if needed, but try and build up your endurance at least once a week.  Weight bearing exercise is also recommended twice weekly.  Eat a healthy diet with lots of vegetables, fruits and fiber.  "Colorful" foods have a lot of vitamins (ie green vegetables, tomatoes, red peppers, etc).  Limit sweet tea, regular sodas and alcoholic beverages, all of which has a lot of calories and sugar.  Up to 1 alcoholic drink daily may be beneficial for women (unless trying to lose weight, watch sugars).  Drink a lot of water.  Calcium recommendations are 1200-1500 mg daily (1500 mg for postmenopausal women or women without ovaries), and vitamin D 1000 IU daily.  This should be obtained from diet and/or supplements (vitamins), and calcium should not be taken all at once, but in divided doses.  Monthly self breast exams and yearly mammograms for women over the age of 36 is recommended.  Sunscreen of at least SPF 30 should be used on all sun-exposed parts of the skin when outside between the hours of 10 am and 4 pm (not just when at beach or pool, but even with exercise, golf, tennis, and yard work!)  Use a sunscreen that says "broad spectrum" so it covers both UVA and UVB rays, and make sure to reapply every 1-2 hours.  Remember to change the batteries in your smoke detectors when changing your clock times in the spring and fall. Carbon monoxide detectors are recommended for your home.  Use your seat belt every time you are in a car, and please drive safely and not be distracted with cell phones and texting while driving.  I recommend getting a COVID booster, of Pfizer or Moderna.  Since we are giivng you your tetanus shot today,  please return in 2 weeks for a nurse visit for this vaccine. Wait at least 2 weeks after the COVID vaccine before getting the Shingrix.  I recommend getting the new shingles vaccine (Shingrix). Check with your insurance to verify what your out of pocket cost may be (usually covered as preventative, but better to verify to avoid any surprises, as this vaccine is expensive), and then schedule a nurse visit at our office when convenient (based on the possible side effects as discussed).   This is a series of 2 injections, spaced 2 months apart.  It doesn't have to be exactly 2 months apart (but can't be under 2 months), if that isn't feasible for your schedule, but try and get them close to 2 months (and definitely within 6 months of each other, or else the efficacy of the vaccine drops off).  You are due for another colonoscopy.  Please contact Dr. Erlinda Hong office and get this scheduled.

## 2021-01-26 NOTE — Progress Notes (Signed)
Chief Complaint  Patient presents with  . Annual Exam    Fasting annual exam with pap. Sees eye doctor annually. Has not yet checked Shingrix coverage. Did receive letter from Dr. Paulita Fujita still needs to schedule. No new concerns.     Hannah Werner is a 60 y.o. female who presents for a complete physical and follow-up on chronic problems.     Anxiety: She continues on sertraline 150mg  (increased from 100mg  2 years ago).  She is tolerating this dose without side effects.  She previously used a roll-on CBD oil which helped, but hasn't felt the need, not currently using.  She had flare of depression (started in the Fall, was at its worst in January), had a hard time getting out of bed, gained some weight, increased alcohol intake (at one point up to 3-4 drinks, but included 6-8 shots). She got counseling through [facility] and is doing better currently.  She cut out watching news (gets updates on phone), has been getting out more, exercising regularly and is feeling better now. Back down to her 1 shot/night (sips it after dinner). Sleep has improved, and moods are better.  Not having much anxiety now.  Pre-diabetes/IFG: Fasting glucose was 103 last year (12/2019).  Highest was 109 at her physical in March 2019. She continues to get regular exercise, watches her carbs.  Lab Results  Component Value Date   HGBA1C 5.6 07/30/2018   Asthma and allergies: Doing well overall. Symbicort was no longer covered by her insurance, so last year we changed to [facility]. She was forgetting to use the [place] during the Fall, but is back to daily use now.  Denies side effects.  Can't recall the last time she needed to use albuterol as a rescue.  She continues to take montelukast daily, at night. Spirometry was normal in 12/2018.   Allergies haven't started flaring yet, not currently using Flonase or antihistamines yet.  High cholesterol: Lipids were high in 12/2014 when eating a lot of bacon at work. She improved her diet, and  rechecks have beenbetter. She continues on lowfat, low cholesterol diet.   Lab Results  Component Value Date   CHOL 212 (H) 01/26/2020   HDL 71 01/26/2020   LDLCALC 119 (H) 01/26/2020   TRIG 124 01/26/2020   CHOLHDL 3.0 01/26/2020   H/o Vitamin D deficiency. Last level was 31.1 in 12/2018, when taking 2000 IU, only missing a dose infrequently.  Prior level was 27.6 in October 2019 when forgetting to take it frequently.  She took 5000 IU early on with COVID, has been taking 2000 IU for the last 9 months or so, taking it consistently.  Immunization History  Administered Date(s) Administered  . DT (Pediatric) 04/19/2000  . Influenza Split 08/14/2011, 07/30/2014  . Influenza, Seasonal, Injecte, Preservative Fre 12/23/2012  . Influenza,inj,Quad PF,6+ Mos 07/30/2018  . Influenza-Unspecified 08/01/2013, 08/29/2019  . Janssen (J&J) SARS-COV-2 Vaccination 01/27/2020  . Pneumococcal Polysaccharide-23 04/05/2011  . Tdap 04/05/2011  . Zoster 11/06/2013   Didn't get her flu shot this year (missed it at work). Last Pap smear: 12/2015, no high risk HPV present Last mammogram: 07/2020 Last colonoscopy: 05/2015; small tubular adenoma; repeat 5 years (Dr. Paulita Fujita) Last DEXA: never  Dentist: twice early  Ophtho: yearly  Exercise: Ab exercises every other day.  Cardio video workouts 2x/week, strength videos once/week. Walks with dog x 45 mins (cardio, not stopping much), most evenings, weather permitting.  Had HIV, RPR and Hepatitis screening done in 07/2005.  Husband hadHep  C, completedtreatment and counts have been negative. She states it never came back. Last Hep C screening was 2016.   PMH, PSH, SH and FH reviewed and updated  Outpatient Encounter Medications as of 01/27/2021  Medication Sig Note  . Ascorbic Acid (VITAMIN C) 1000 MG tablet Take 1,000 mg by mouth daily.   . cholecalciferol (VITAMIN D3) 25 MCG (1000 UNIT) tablet Take 2,000 Units by mouth daily.   Kendall Flack 575  MG/5ML SYRP Take by mouth daily.   . fluticasone furoate-vilanterol (BREO ELLIPTA) 200-25 MCG/INH AEPB Inhale 1 puff into the lungs daily.   . montelukast (SINGULAIR) 10 MG tablet Take 1 tablet (10 mg total) by mouth at bedtime.   . sertraline (ZOLOFT) 100 MG tablet TAKE 1 AND 1/2 TABLETS BY MOUTH DAILY ATBEDTIME   . TURMERIC PO Take 1 tablet by mouth daily.   . Zinc 50 MG CAPS Take 1 capsule by mouth daily.   Marland Kitchen albuterol (PROVENTIL HFA;VENTOLIN HFA) 108 (90 Base) MCG/ACT inhaler Inhale 1-2 puffs into the lungs every 6 (six) hours as needed for wheezing or shortness of breath. Reported on 01/05/2016 (Patient not taking: No sig reported)   . fluticasone (FLONASE) 50 MCG/ACT nasal spray Place 2 sprays into both nostrils daily. (Patient not taking: No sig reported) 01/05/2016: Uses prn, not currently  . [DISCONTINUED] aspirin 81 MG chewable tablet Chew 1 tablet (81 mg total) by mouth daily. (Patient not taking: Reported on 01/20/2019) 01/20/2019: Takes sporadically, forgets a lot   No facility-administered encounter medications on file as of 01/27/2021.   Allergies  Allergen Reactions  . Erythromycin Nausea And Vomiting    ROS: The patient denies anorexia, fever, headaches, vision changes, ear pain, sore throat, breast concerns, chest pain, dizziness, syncope, dyspnea on exertion, swelling, nausea, vomiting, diarrhea, abdominal pain, melena, hematochezia, hematuria, incontinence, dysuria, vaginal bleeding, discharge, odor or itch, genital lesions, numbness, weakness, tremor, suspicious skin lesions, depression, abnormal bleeding/bruising, or enlarged lymph nodes.  Pain and stiffness at index fingers bilaterally Sometimes is forgetful ("little things"--names; no issues with appointments, bills).  This was worse during recent depression, but resolved now. Anxiety and depression per HPI, both improved. Some hearing loss and tinnitus in the right ear. Unchanged from last year   PHYSICAL EXAM:  BP 120/78    Pulse 76   Ht 5' 5.5" (1.664 m)   Wt 170 lb 12.8 oz (77.5 kg)   BMI 27.99 kg/m   Wt Readings from Last 3 Encounters:  01/27/21 170 lb 12.8 oz (77.5 kg)  01/26/20 173 lb 3.2 oz (78.6 kg)  03/20/19 169 lb (76.7 kg)    General Appearance:  Alert, cooperative, no distress, appears stated age   Head:  Normocephalic, without obvious abnormality, atraumatic   Eyes:  PERRL, conjunctiva/corneas clear, EOM's intact, fundi benign   Ears:  Normal TM's and external ear canals  Nose:  Not examined, wearing mask due to COVID-19 pandemic  Throat:  Not examined, wearing mask due to COVID-19 pandemic.   Neck:  Supple, no lymphadenopathy; thyroid: no enlargement/tenderness/ nodules; no carotid bruit or JVD   Back:  Spine nontender, no curvature, ROM normal, no CVA tenderness   Lungs:  Clear to auscultation bilaterally without rales or ronchi; respirations unlabored.  Chest Wall:  No tenderness or deformity   Heart:  Regular rate and rhythm, S1 and S2 normal, no murmur, rub or gallop   Breast Exam:  No tenderness, masses, or nipple discharge or inversion. No axillary lymphadenopathy   Abdomen:  Soft,  non-tender, nondistended, normoactive bowel sounds, no masses, no hepatosplenomegaly   Genitalia:  Normal external genitalia without lesions; very mild atrophic changes. BUS and vagina normal; no cervical lesions or cervical motion tenderness. Stenotic cervical os noted. No abnormal vaginal discharge. Uterus and adnexa not enlarged, nontender, no masses. Papperformed.   Rectal:  Normal tone, no masses or tenderness; guaiac negative stool. Inflamed external hemorrhoid, nontender  Extremities:  No clubbing, cyanosis or edema. Arthritis changes to DIPs of bilateral index fingers.  Pulses:  2+ and symmetric all extremities   Skin:  Skin color, texture, turgor normal.  Lymph nodes:  Cervical, supraclavicular, and axillary nodes normal    Neurologic:  Normal strength, sensation and gait; reflexes 2+ and symmetric throughout(2-3+ at knees bilaterally)  Psych:  Normal mood, affect, hygiene and grooming  GAD-7 score of 2     ASSESSMENT/PLAN:  Annual physical exam - Plan: POCT Urinalysis DIP (Proadvantage Device), CBC with Differential/Platelet, Comprehensive metabolic panel, Lipid panel, Cytology - PAP([place])  Asthma in remission - Will try lower dose of Breo  Refilled albuterol (to have unexpired inhaler at home).  - Plan: montelukast (SINGULAIR) 10 MG tablet, fluticasone furoate-vilanterol (BREO ELLIPTA) 100-25 MCG/INH AEPB, albuterol (VENTOLIN HFA) 108 (90 Base) MCG/ACT inhaler  Impaired fasting glucose - counseled re: proper diet, exercise, weight - Plan: Comprehensive metabolic panel  Vitamin D deficiency - continue daily supplements  Need for Tdap vaccination - Plan: Tdap vaccine greater than or equal to 7yo IM  Generalized anxiety disorder - well controlled on sertraline 150mg  daily, continue; Recent depression, resolved with counseling - Plan: sertraline (ZOLOFT) 100 MG tablet   Discussed monthly self breast exams and yearly mammograms; at least 30 minutes of aerobic activity at least 5 days/week ,weight-bearing exercise 2x/wk; proper sunscreen use reviewed; healthy diet, including goals of calcium and vitamin D intake and alcohol recommendations (less than or equal to 1 drink/day) reviewed; regular seatbelt use; changing batteries in smoke detectors. Immunization recommendations discussed--yearly flu shots recommended (advised of options if she misses it at work). Shingrix recommended, risks/side effects reviewed (to check with insurance and schedule NV when convenient. TdaP given today. COVID booster recommended.  Will consider Transport planner) and return for NV (in at least 2 weeks from today, due to getting TdaP). Colonoscopy recommendations reviewed--was due  05/2020.  Reminded to schedule. Pap today  F/u 1 year, sooner prn

## 2021-01-27 ENCOUNTER — Other Ambulatory Visit: Payer: Self-pay

## 2021-01-27 ENCOUNTER — Telehealth: Payer: Self-pay | Admitting: *Deleted

## 2021-01-27 ENCOUNTER — Encounter: Payer: Self-pay | Admitting: Family Medicine

## 2021-01-27 ENCOUNTER — Ambulatory Visit (INDEPENDENT_AMBULATORY_CARE_PROVIDER_SITE_OTHER): Payer: 59 | Admitting: Family Medicine

## 2021-01-27 ENCOUNTER — Other Ambulatory Visit (HOSPITAL_COMMUNITY)
Admission: RE | Admit: 2021-01-27 | Discharge: 2021-01-27 | Disposition: A | Discharge status: Home / Self Care | Payer: 59 | Source: Ambulatory Visit | Attending: Family Medicine | Admitting: Family Medicine

## 2021-01-27 VITALS — BP 120/78 | HR 76 | Ht 65.5 in | Wt 170.8 lb

## 2021-01-27 DIAGNOSIS — R7301 Impaired fasting glucose: Secondary | ICD-10-CM | POA: Diagnosis not present

## 2021-01-27 DIAGNOSIS — E559 Vitamin D deficiency, unspecified: Secondary | ICD-10-CM

## 2021-01-27 DIAGNOSIS — Z0000 Encounter for general adult medical examination without abnormal findings: Secondary | ICD-10-CM | POA: Insufficient documentation

## 2021-01-27 DIAGNOSIS — J45998 Other asthma: Secondary | ICD-10-CM | POA: Diagnosis not present

## 2021-01-27 DIAGNOSIS — Z23 Encounter for immunization: Secondary | ICD-10-CM | POA: Diagnosis not present

## 2021-01-27 DIAGNOSIS — F411 Generalized anxiety disorder: Secondary | ICD-10-CM

## 2021-01-27 LAB — POCT URINALYSIS DIP (PROADVANTAGE DEVICE)
Bilirubin, UA: NEGATIVE
Blood, UA: NEGATIVE
Glucose, UA: NEGATIVE mg/dL
Ketones, POC UA: NEGATIVE mg/dL
Leukocytes, UA: NEGATIVE
Nitrite, UA: NEGATIVE
Protein Ur, POC: NEGATIVE mg/dL
Specific Gravity, Urine: 1.015
Urobilinogen, Ur: NEGATIVE
pH, UA: 7 (ref 5.0–8.0)

## 2021-01-27 MED ORDER — FLUTICASONE FUROATE-VILANTEROL 100-25 MCG/INH IN AEPB: 1.0000 | INHALATION_SPRAY | Freq: Every day | RESPIRATORY_TRACT | 11 refills | Status: DC

## 2021-01-27 MED ORDER — SERTRALINE HCL 100 MG PO TABS: ORAL_TABLET | ORAL | 3 refills | Status: DC

## 2021-01-27 MED ORDER — MONTELUKAST SODIUM 10 MG PO TABS
10.0000 mg | ORAL_TABLET | Freq: Every day | ORAL | 3 refills | Status: DC
Start: 2021-01-27 — End: 2022-02-13

## 2021-01-27 MED ORDER — ALBUTEROL SULFATE HFA 108 (90 BASE) MCG/ACT IN AERS: 1.0000 | INHALATION_SPRAY | Freq: Four times a day (QID) | RESPIRATORY_TRACT | 1 refills | Status: AC | PRN

## 2021-01-27 NOTE — Telephone Encounter (Signed)
Can you please order pap on this patient? Thanks.

## 2021-01-27 NOTE — Telephone Encounter (Signed)
Done - sorry!

## 2021-01-28 LAB — LIPID PANEL
Chol/HDL Ratio: 3.4 ratio (ref 0.0–4.4)
Cholesterol, Total: 218 mg/dL — ABNORMAL HIGH (ref 100–199)
HDL: 65 mg/dL (ref >39)
LDL Chol Calc (NIH): 133 mg/dL — ABNORMAL HIGH (ref 0–99)
Triglycerides: 111 mg/dL (ref 0–149)
VLDL Cholesterol Cal: 20 mg/dL (ref 5–40)

## 2021-01-28 LAB — CBC WITH DIFFERENTIAL/PLATELET
Basophils Absolute: 0 10*3/uL (ref 0.0–0.2)
Basos: 1 %
EOS (ABSOLUTE): 0.2 10*3/uL (ref 0.0–0.4)
Eos: 4 %
Hematocrit: 40.4 % (ref 34.0–46.6)
Hemoglobin: 14.1 g/dL (ref 11.1–15.9)
Immature Grans (Abs): 0 10*3/uL (ref 0.0–0.1)
Immature Granulocytes: 0 %
Lymphocytes Absolute: 1.4 10*3/uL (ref 0.7–3.1)
Lymphs: 29 %
MCH: 30.9 pg (ref 26.6–33.0)
MCHC: 34.9 g/dL (ref 31.5–35.7)
MCV: 88 fL (ref 79–97)
Monocytes Absolute: 0.4 10*3/uL (ref 0.1–0.9)
Monocytes: 8 %
Neutrophils Absolute: 2.9 10*3/uL (ref 1.4–7.0)
Neutrophils: 58 %
Platelets: 243 10*3/uL (ref 150–450)
RBC: 4.57 x10E6/uL (ref 3.77–5.28)
RDW: 11.4 % — ABNORMAL LOW (ref 11.7–15.4)
WBC: 4.9 10*3/uL (ref 3.4–10.8)

## 2021-01-28 LAB — COMPREHENSIVE METABOLIC PANEL
ALT: 15 IU/L (ref 0–32)
AST: 12 IU/L (ref 0–40)
Albumin/Globulin Ratio: 2.3 — ABNORMAL HIGH (ref 1.2–2.2)
Albumin: 4.8 g/dL (ref 3.8–4.9)
Alkaline Phosphatase: 101 IU/L (ref 44–121)
BUN/Creatinine Ratio: 18 (ref 9–23)
BUN: 13 mg/dL (ref 6–24)
Bilirubin Total: 0.4 mg/dL (ref 0.0–1.2)
CO2: 22 mmol/L (ref 20–29)
Calcium: 10.1 mg/dL (ref 8.7–10.2)
Chloride: 101 mmol/L (ref 96–106)
Creatinine, Ser: 0.73 mg/dL (ref 0.57–1.00)
Globulin, Total: 2.1 g/dL (ref 1.5–4.5)
Glucose: 98 mg/dL (ref 65–99)
Potassium: 5.1 mmol/L (ref 3.5–5.2)
Sodium: 140 mmol/L (ref 134–144)
Total Protein: 6.9 g/dL (ref 6.0–8.5)
eGFR: 95 mL/min/{1.73_m2} (ref >59)

## 2021-01-31 LAB — CYTOLOGY - PAP
Comment: NEGATIVE
Diagnosis: NEGATIVE
High risk HPV: NEGATIVE

## 2021-09-17 LAB — HM MAMMOGRAPHY

## 2021-09-28 ENCOUNTER — Encounter: Payer: Self-pay | Admitting: Family Medicine

## 2022-02-09 ENCOUNTER — Encounter: Payer: 59 | Admitting: Family Medicine

## 2022-02-13 ENCOUNTER — Other Ambulatory Visit: Payer: Self-pay | Admitting: Family Medicine

## 2022-02-13 DIAGNOSIS — J45998 Other asthma: Secondary | ICD-10-CM

## 2022-02-13 NOTE — Telephone Encounter (Signed)
Is this okay to refill? 

## 2022-02-25 ENCOUNTER — Other Ambulatory Visit: Payer: Self-pay | Admitting: Family Medicine

## 2022-02-25 DIAGNOSIS — F411 Generalized anxiety disorder: Secondary | ICD-10-CM

## 2022-05-07 NOTE — Patient Instructions (Incomplete)
  HEALTH MAINTENANCE RECOMMENDATIONS:  It is recommended that you get at least 30 minutes of aerobic exercise at least 5 days/week (for weight loss, you may need as much as 60-90 minutes). This can be any activity that gets your heart rate up. This can be divided in 10-15 minute intervals if needed, but try and build up your endurance at least once a week.  Weight bearing exercise is also recommended twice weekly.  Eat a healthy diet with lots of vegetables, fruits and fiber.  "Colorful" foods have a lot of vitamins (ie green vegetables, tomatoes, red peppers, etc).  Limit sweet tea, regular sodas and alcoholic beverages, all of which has a lot of calories and sugar.  Up to 1 alcoholic drink daily may be beneficial for women (unless trying to lose weight, watch sugars).  Drink a lot of water.  Calcium recommendations are 1200-1500 mg daily (1500 mg for postmenopausal women or women without ovaries), and vitamin D 1000 IU daily.  This should be obtained from diet and/or supplements (vitamins), and calcium should not be taken all at once, but in divided doses.  Monthly self breast exams and yearly mammograms for women over the age of 11 is recommended.  Sunscreen of at least SPF 30 should be used on all sun-exposed parts of the skin when outside between the hours of 10 am and 4 pm (not just when at beach or pool, but even with exercise, golf, tennis, and yard work!)  Use a sunscreen that says "broad spectrum" so it covers both UVA and UVB rays, and make sure to reapply every 1-2 hours.  Remember to change the batteries in your smoke detectors when changing your clock times in the spring and fall. Carbon monoxide detectors are recommended for your home.  Use your seat belt every time you are in a car, and please drive safely and not be distracted with cell phones and texting while driving.  I recommend getting the new shingles vaccine (Shingrix). You may want to check with your insurance to verify what  your out of pocket cost may be (usually covered as preventative, but better to verify to avoid any surprises, as this vaccine is expensive), and then schedule a nurse visit at our office when convenient (based on the possible side effects as discussed).   This is a series of 2 injections, spaced 2 months apart.  It doesn't have to be exactly 2 months apart (but can't be sooner), if that isn't feasible for your schedule, but try and get them close to 2 months (and definitely within 6 months of each other, or else the efficacy of the vaccine drops off). This should be separated from other vaccines by at least 2 weeks.  COVID booster is recommended.  At this point, you may want to wait until September when the new/updated booster becomes available (this will have more updated coverage of the variants compared to the one that came out in 06/2021).  Colonoscopy

## 2022-05-07 NOTE — Progress Notes (Unsigned)
No chief complaint on file.   Hannah Werner is a 61 y.o. female who presents for a complete physical and follow-up on chronic problems.     Anxiety: She continues on sertraline 137m.  She is tolerating this dose without side effects.   She had flare of depression last year, got counseling through [facility], limited news watching, was getting regular exercise and moods improved.  Moods now are good.  Pre-diabetes/IFG:  Fasting glucose was 98 at physical last year, 103 in 12/2019, max of 109 at her physical in March 2019. She continues to get regular exercise, watches her carbs.  Lab Results  Component Value Date   HGBA1C 5.6 07/30/2018   Asthma and allergies:  Doing well overall. Compliant with Breo and montelukast (switched from Symbicort in the past due to insurance reasons). Denies side effects.  Can't recall the last time she needed to use albuterol as a rescue.  Spirometry was normal in 12/2018.   Allergies haven't started flaring yet, not currently using Flonase or antihistamines yet.  High cholesterol:  Lipids were high in 12/2014 when eating a lot of bacon at work. She improved her diet, and rechecks have been better. She continues on lowfat, low cholesterol diet.  Due for recheck.  Lab Results  Component Value Date   CHOL 218 (H) 01/27/2021   HDL 65 01/27/2021   LDLCALC 133 (H) 01/27/2021   TRIG 111 01/27/2021   CHOLHDL 3.4 01/27/2021   H/o Vitamin D deficiency.  Last level was 31.1 in 12/2018, when taking 2000 IU, only missing a dose infrequently.  Prior level was 27.6 in October 2019 when forgetting to take it frequently.  She took 5000 IU early on with COVID, has been taking 2000 IU for the last 9 months or so, taking it consistently.  Immunization History  Administered Date(s) Administered   DT (Pediatric) 04/19/2000   Influenza Split 08/14/2011, 07/30/2014   Influenza, Seasonal, Injecte, Preservative Fre 12/23/2012   Influenza,inj,Quad PF,6+ Mos 07/30/2018    Influenza-Unspecified 08/01/2013, 08/29/2019   Janssen (J&J) SARS-COV-2 Vaccination 01/27/2020   Pneumococcal Polysaccharide-23 04/05/2011   Tdap 04/05/2011, 01/27/2021   Zoster, Live 11/06/2013   Didn't get her flu shot this year (missed it at work). Last Pap smear:  12/2020, no high risk HPV present Last mammogram:  08/2021 Last colonoscopy: 05/2015; small tubular adenoma; repeat 5 years (Dr. OPaulita Fujita Last DEXA: never   Dentist: twice early   Ophtho: yearly   Exercise:  Ab exercises every other day.  Cardio video workouts 2x/week, strength videos once/week. Walks with dog x 45 mins (cardio, not stopping much), most evenings, weather permitting.   Had HIV, RPR and Hepatitis screening done in 07/2005.    Husband had Hep C, completed treatment and counts have been negative. She states it never came back.  Last Hep C screening was 2016.   PMH, PSH, SH and FH reviewed and updated     ROS: The patient denies anorexia, fever, headaches, vision changes, ear pain, sore throat, breast concerns, chest pain, dizziness, syncope, dyspnea on exertion, swelling, nausea, vomiting, diarrhea, abdominal pain, melena, hematochezia, hematuria, incontinence, dysuria, vaginal bleeding, discharge, odor or itch, genital lesions, numbness, weakness, tremor, suspicious skin lesions, depression, abnormal bleeding/bruising, or enlarged lymph nodes.   Pain and stiffness at index fingers bilaterally Sometimes is forgetful ("little things"--names; no issues with appointments, bills).  This was worse during recent depression, but resolved now. Anxiety and depression per HPI, both improved. Some hearing loss and tinnitus in the right  ear. Unchanged from last year   PHYSICAL EXAM:  There were no vitals taken for this visit.  Wt Readings from Last 3 Encounters:  01/27/21 170 lb 12.8 oz (77.5 kg)  01/26/20 173 lb 3.2 oz (78.6 kg)  03/20/19 169 lb (76.7 kg)    General Appearance:    Alert, cooperative, no  distress, appears stated age     Head:    Normocephalic, without obvious abnormality, atraumatic     Eyes:    PERRL, conjunctiva/corneas clear, EOM's intact, fundi benign     Ears:    Normal TM's and external ear canals  Nose:    Not examined, wearing mask due to COVID-19 pandemic    Throat:    Not examined, wearing mask due to COVID-19 pandemic .   Neck:    Supple, no lymphadenopathy; thyroid: no enlargement/tenderness/ nodules; no carotid bruit or JVD     Back:    Spine nontender, no curvature, ROM normal, no CVA tenderness     Lungs:    Clear to auscultation bilaterally without rales or ronchi; respirations unlabored.  Chest Wall:    No tenderness or deformity     Heart:    Regular rate and rhythm, S1 and S2 normal, no murmur, rub or gallop     Breast Exam:    No tenderness, masses, or nipple discharge or inversion. No axillary lymphadenopathy     Abdomen:    Soft, non-tender, nondistended, normoactive bowel sounds, no masses, no hepatosplenomegaly     Genitalia:    Normal external genitalia without lesions; very mild atrophic changes. BUS and vagina normal; no cervical lesions or cervical motion tenderness. Stenotic cervical os noted. No abnormal vaginal discharge. Uterus and adnexa not enlarged, nontender, no masses. Pap performed.   Rectal:    Normal tone, no masses or tenderness; guaiac negative stool. Inflamed external hemorrhoid, nontender  Extremities:    No clubbing, cyanosis or edema. Arthritis changes to DIPs of bilateral index fingers.   Pulses:    2+ and symmetric all extremities     Skin:    Skin color, texture, turgor normal.  Lymph nodes:    Cervical, supraclavicular, and axillary nodes normal     Neurologic:    Normal strength, sensation and gait; reflexes 2+ and symmetric throughout (2-3+ at knees bilaterally)                       Psych:    Normal mood, affect, hygiene and grooming    GAD-7 score of 2     ASSESSMENT/PLAN:  Enter flu shot if she had. Any COVID  boosters, or just J&J? Did she ever schedule/get colonoscopy? (Was due in 05/2020) Spirometry Pneumovax booster Shingrix (might not want both today) COVID booster rec (can wait until Sept for new one)  RF singulair, zoloft, Breo ?albuterol?  Cbc, c-met, lipids, D ?HIV (care gap)  Discussed monthly self breast exams and yearly mammograms; at least 30 minutes of aerobic activity at least 5 days/week ,weight-bearing exercise 2x/wk; proper sunscreen use reviewed; healthy diet, including goals of calcium and vitamin D intake and alcohol recommendations (less than or equal to 1 drink/day) reviewed; regular seatbelt use; changing batteries in smoke detectors.  Immunization recommendations discussed--yearly flu shots recommended (advised of options if she misses it at work).  Shingrix recommended, risks/side effects reviewed (to check with insurance and schedule NV when convenient. COVID booster recommended, rec waiting for updated one to be out in September, at this point.  Colonoscopy recommendations reviewed--was due  05/2020. Reminded to schedule. ***UPDATE  F/u 1 year, sooner prn

## 2022-05-09 ENCOUNTER — Ambulatory Visit (INDEPENDENT_AMBULATORY_CARE_PROVIDER_SITE_OTHER): Payer: 59 | Admitting: Family Medicine

## 2022-05-09 ENCOUNTER — Encounter: Payer: Self-pay | Admitting: Family Medicine

## 2022-05-09 VITALS — BP 120/80 | HR 72 | Ht 65.0 in | Wt 169.6 lb

## 2022-05-09 DIAGNOSIS — Z800 Family history of malignant neoplasm of digestive organs: Secondary | ICD-10-CM | POA: Diagnosis not present

## 2022-05-09 DIAGNOSIS — J45998 Other asthma: Secondary | ICD-10-CM | POA: Diagnosis not present

## 2022-05-09 DIAGNOSIS — E559 Vitamin D deficiency, unspecified: Secondary | ICD-10-CM

## 2022-05-09 DIAGNOSIS — Z23 Encounter for immunization: Secondary | ICD-10-CM | POA: Diagnosis not present

## 2022-05-09 DIAGNOSIS — Z1211 Encounter for screening for malignant neoplasm of colon: Secondary | ICD-10-CM

## 2022-05-09 DIAGNOSIS — F411 Generalized anxiety disorder: Secondary | ICD-10-CM | POA: Diagnosis not present

## 2022-05-09 DIAGNOSIS — Z0000 Encounter for general adult medical examination without abnormal findings: Secondary | ICD-10-CM

## 2022-05-09 LAB — POCT URINALYSIS DIP (PROADVANTAGE DEVICE)
Bilirubin, UA: NEGATIVE
Blood, UA: NEGATIVE
Glucose, UA: NEGATIVE mg/dL
Ketones, POC UA: NEGATIVE mg/dL
Leukocytes, UA: NEGATIVE
Nitrite, UA: NEGATIVE
Protein Ur, POC: NEGATIVE mg/dL
Specific Gravity, Urine: 1.015
Urobilinogen, Ur: NEGATIVE
pH, UA: 7 (ref 5.0–8.0)

## 2022-05-09 MED ORDER — FLUTICASONE FUROATE-VILANTEROL 100-25 MCG/ACT IN AEPB: 1.0000 | INHALATION_SPRAY | Freq: Every day | RESPIRATORY_TRACT | 11 refills | Status: DC

## 2022-05-09 MED ORDER — MONTELUKAST SODIUM 10 MG PO TABS: 10.0000 mg | ORAL_TABLET | Freq: Every day | ORAL | 3 refills | Status: DC

## 2022-05-09 MED ORDER — SERTRALINE HCL 100 MG PO TABS: 150.0000 mg | ORAL_TABLET | Freq: Every day | ORAL | 3 refills | Status: DC

## 2022-05-10 LAB — CBC WITH DIFFERENTIAL/PLATELET
Basophils Absolute: 0.1 10*3/uL (ref 0.0–0.2)
Basos: 1 %
EOS (ABSOLUTE): 0.3 10*3/uL (ref 0.0–0.4)
Eos: 5 %
Hematocrit: 41.3 % (ref 34.0–46.6)
Hemoglobin: 13.7 g/dL (ref 11.1–15.9)
Immature Grans (Abs): 0 10*3/uL (ref 0.0–0.1)
Immature Granulocytes: 0 %
Lymphocytes Absolute: 1.4 10*3/uL (ref 0.7–3.1)
Lymphs: 26 %
MCH: 30.4 pg (ref 26.6–33.0)
MCHC: 33.2 g/dL (ref 31.5–35.7)
MCV: 92 fL (ref 79–97)
Monocytes Absolute: 0.4 10*3/uL (ref 0.1–0.9)
Monocytes: 7 %
Neutrophils Absolute: 3.4 10*3/uL (ref 1.4–7.0)
Neutrophils: 61 %
Platelets: 238 10*3/uL (ref 150–450)
RBC: 4.5 x10E6/uL (ref 3.77–5.28)
RDW: 11.8 % (ref 11.7–15.4)
WBC: 5.5 10*3/uL (ref 3.4–10.8)

## 2022-05-10 LAB — LIPID PANEL
Chol/HDL Ratio: 3.5 ratio (ref 0.0–4.4)
Cholesterol, Total: 245 mg/dL — ABNORMAL HIGH (ref 100–199)
HDL: 71 mg/dL (ref >39)
LDL Chol Calc (NIH): 155 mg/dL — ABNORMAL HIGH (ref 0–99)
Triglycerides: 110 mg/dL (ref 0–149)
VLDL Cholesterol Cal: 19 mg/dL (ref 5–40)

## 2022-05-10 LAB — COMPREHENSIVE METABOLIC PANEL
ALT: 22 IU/L (ref 0–32)
AST: 16 IU/L (ref 0–40)
Albumin/Globulin Ratio: 2.6 — ABNORMAL HIGH (ref 1.2–2.2)
Albumin: 5.1 g/dL — ABNORMAL HIGH (ref 3.8–4.9)
Alkaline Phosphatase: 87 IU/L (ref 44–121)
BUN/Creatinine Ratio: 14 (ref 12–28)
BUN: 10 mg/dL (ref 8–27)
Bilirubin Total: 0.5 mg/dL (ref 0.0–1.2)
CO2: 21 mmol/L (ref 20–29)
Calcium: 10.1 mg/dL (ref 8.7–10.3)
Chloride: 102 mmol/L (ref 96–106)
Creatinine, Ser: 0.71 mg/dL (ref 0.57–1.00)
Globulin, Total: 2 g/dL (ref 1.5–4.5)
Glucose: 98 mg/dL (ref 70–99)
Potassium: 4.7 mmol/L (ref 3.5–5.2)
Sodium: 140 mmol/L (ref 134–144)
Total Protein: 7.1 g/dL (ref 6.0–8.5)
eGFR: 97 mL/min/{1.73_m2} (ref >59)

## 2022-05-10 LAB — HIV ANTIBODY (ROUTINE TESTING W REFLEX): HIV Screen 4th Generation wRfx: NONREACTIVE

## 2022-05-10 LAB — VITAMIN D 25 HYDROXY (VIT D DEFICIENCY, FRACTURES): Vit D, 25-Hydroxy: 34.2 ng/mL (ref 30.0–100.0)

## 2022-06-14 ENCOUNTER — Other Ambulatory Visit: Payer: Self-pay | Admitting: Gastroenterology

## 2022-06-14 DIAGNOSIS — R1011 Right upper quadrant pain: Secondary | ICD-10-CM

## 2022-06-16 ENCOUNTER — Ambulatory Visit
Admission: RE | Admit: 2022-06-16 | Discharge: 2022-06-16 | Disposition: A | Discharge status: Home / Self Care | Payer: 59 | Source: Ambulatory Visit | Attending: Gastroenterology | Admitting: Gastroenterology

## 2022-06-16 DIAGNOSIS — R1011 Right upper quadrant pain: Secondary | ICD-10-CM

## 2022-07-05 ENCOUNTER — Encounter: Payer: Self-pay | Admitting: Internal Medicine

## 2022-07-10 ENCOUNTER — Other Ambulatory Visit (INDEPENDENT_AMBULATORY_CARE_PROVIDER_SITE_OTHER): Payer: 59

## 2022-07-10 DIAGNOSIS — Z23 Encounter for immunization: Secondary | ICD-10-CM | POA: Diagnosis not present

## 2022-08-08 ENCOUNTER — Encounter: Payer: Self-pay | Admitting: Internal Medicine

## 2022-09-04 LAB — HM COLONOSCOPY

## 2022-10-07 LAB — HM MAMMOGRAPHY

## 2022-10-12 ENCOUNTER — Encounter: Payer: Self-pay | Admitting: *Deleted

## 2022-11-01 ENCOUNTER — Emergency Department (HOSPITAL_BASED_OUTPATIENT_CLINIC_OR_DEPARTMENT_OTHER)
Admission: EM | Admit: 2022-11-01 | Discharge: 2022-11-01 | Disposition: A | Discharge status: Home / Self Care | Payer: 59 | Attending: Emergency Medicine | Admitting: Emergency Medicine

## 2022-11-01 ENCOUNTER — Encounter (HOSPITAL_BASED_OUTPATIENT_CLINIC_OR_DEPARTMENT_OTHER): Payer: Self-pay

## 2022-11-01 ENCOUNTER — Emergency Department (HOSPITAL_BASED_OUTPATIENT_CLINIC_OR_DEPARTMENT_OTHER): Payer: 59

## 2022-11-01 ENCOUNTER — Other Ambulatory Visit (HOSPITAL_BASED_OUTPATIENT_CLINIC_OR_DEPARTMENT_OTHER): Payer: Self-pay

## 2022-11-01 ENCOUNTER — Other Ambulatory Visit: Payer: Self-pay

## 2022-11-01 DIAGNOSIS — J45909 Unspecified asthma, uncomplicated: Secondary | ICD-10-CM | POA: Diagnosis not present

## 2022-11-01 DIAGNOSIS — S2231XA Fracture of one rib, right side, initial encounter for closed fracture: Secondary | ICD-10-CM

## 2022-11-01 DIAGNOSIS — W010XXA Fall on same level from slipping, tripping and stumbling without subsequent striking against object, initial encounter: Secondary | ICD-10-CM | POA: Diagnosis not present

## 2022-11-01 DIAGNOSIS — M546 Pain in thoracic spine: Secondary | ICD-10-CM | POA: Diagnosis present

## 2022-11-01 MED ORDER — SENNOSIDES-DOCUSATE SODIUM 8.6-50 MG PO TABS
1.0000 | ORAL_TABLET | Freq: Every evening | ORAL | 0 refills | Status: AC | PRN
  Filled 2022-11-01: qty 100, 100d supply, fill #0

## 2022-11-01 MED ORDER — OXYCODONE-ACETAMINOPHEN 5-325 MG PO TABS
1.0000 | ORAL_TABLET | Freq: Four times a day (QID) | ORAL | 0 refills | Status: DC | PRN
  Filled 2022-11-01: qty 12, 3d supply, fill #0

## 2022-11-01 MED ORDER — DICLOFENAC SODIUM 1 % EX GEL
2.0000 g | Freq: Four times a day (QID) | CUTANEOUS | 0 refills | Status: DC | PRN
  Filled 2022-11-01: qty 100, 14d supply, fill #0

## 2022-11-01 NOTE — ED Notes (Signed)
ED Provider at bedside. 

## 2022-11-01 NOTE — Discharge Instructions (Signed)
Your workup today showed that you have a fracture to one or more ribs.  Unfortunately this type of injury hurts but there is no way to fix it immediately; it must heal over time.  Be sure to take plenty of deep breaths so that you get rid of the "bad air" in your lungs.  If you are given a device called an incentive spirometer, please use it as recommended.  Unless you have been told by your doctor not to do so, we recommend you take ibuprofen 600 mg 3 times daily with meals for no more than 5 days.  You can also take Tylenol 1000 mg every 6 hours for pain. Please note that Percocet also has some Tylenol in it ('325mg'$ ). You need to account for this if you are taking additional Tylenol.   Follow-up at the clinics or with the doctors described in this paperwork.  Return to the emergency department if he develop new or worsening symptoms that concern you.

## 2022-11-01 NOTE — ED Notes (Signed)
Pt verbalized understanding of discharge instructions. Opportunity for questions provided.  

## 2022-11-01 NOTE — ED Provider Notes (Signed)
Emergency Department Provider Note   I have reviewed the triage vital signs and the nursing notes.   HISTORY  Chief Complaint Fall and Back Pain   HPI Hannah Werner is a 62 y.o. female ***   {**SYMPTOM/COMPLAINT  LOCATION (describe anatomically) DURATION (when did it start) TIMING (onset and pattern) SEVERITY (0-10, mild/moderate/severe) QUALITY (description of symptoms) CONTEXT (recent surgery, new meds, activity, etc.) MODIFYINGFACTORS (what makes it better/worse) ASSOCIATEDSYMPTOMS (pertinent positives and negatives)**}  Past Medical History:  Diagnosis Date   Adenomatous colon polyp 05/2015   tubular adenoma (Dr. Paulita Fujita); repeat 05/2020   Allergy    seasonal   Anxiety    Asthma    Exposure to hepatitis C    husband   Family hx of colon cancer    mom   Pure hyperglyceridemia    Unspecified vitamin D deficiency 05/2009    Review of Systems {** Revise as appropriate then delete this line - Documentation of 10 systems OR 2 systems and "10-point ROS otherwise negative" is required **}Constitutional: No fever/chills Eyes: No visual changes. ENT: No sore throat. Cardiovascular: Denies chest pain. Respiratory: Denies shortness of breath. Gastrointestinal: No abdominal pain.  No nausea, no vomiting.  No diarrhea.  No constipation. Genitourinary: Negative for dysuria. Musculoskeletal: Negative for back pain. Skin: Negative for rash. Neurological: Negative for headaches, focal weakness or numbness. {**Psychiatric:  Endocrine:  Hematological/Lymphatic:  Allergic/Immunilogical: **}  ____________________________________________   PHYSICAL EXAM:  VITAL SIGNS: ED Triage Vitals  Enc Vitals Group     BP 11/01/22 1037 (!) 142/80     Pulse Rate 11/01/22 1037 89     Resp 11/01/22 1037 18     Temp 11/01/22 1037 97.7 F (36.5 C)     Temp Source 11/01/22 1037 Oral     SpO2 11/01/22 1037 100 %     Weight 11/01/22 1037 169 lb (76.7 kg)     Height 11/01/22 1037  '5\' 6"'$  (1.676 m)     Head Circumference --      Peak Flow --      Pain Score 11/01/22 1037 10     Pain Loc --      Pain Edu? --      Excl. in [place]? --    {** Revise as appropriate then delete this line - 8 systems required **} Constitutional: Alert and oriented. Well appearing and in no acute distress. Eyes: Conjunctivae are normal. PERRL. EOMI. Head: Atraumatic. {**Ears:  Healthy appearing ear canals and TMs bilaterally **}Nose: No congestion/rhinnorhea. Mouth/Throat: Mucous membranes are moist.  Oropharynx non-erythematous. Neck: No stridor.  No meningeal signs.  {**No cervical spine tenderness to palpation.**} Cardiovascular: Normal rate, regular rhythm. Good peripheral circulation. Grossly normal heart sounds.   Respiratory: Normal respiratory effort.  No retractions. Lungs CTAB. Gastrointestinal: Soft and nontender. No distention.  {**Genitourinary:  **}Musculoskeletal: No lower extremity tenderness nor edema. No gross deformities of extremities. Neurologic:  Normal speech and language. No gross focal neurologic deficits are appreciated.  Skin:  Skin is warm, dry and intact. No rash noted. {**Psychiatric: Mood and affect are normal. Speech and behavior are normal.**}  ____________________________________________   LABS (all labs ordered are listed, but only abnormal results are displayed)  Labs Reviewed - No data to display ____________________________________________  EKG  *** ____________________________________________  RADIOLOGY  No results found.  ____________________________________________   PROCEDURES  Procedure(s) performed:   Procedures   ____________________________________________   INITIAL IMPRESSION / ASSESSMENT AND PLAN / ED COURSE  Pertinent labs & imaging  results that were available during my care of the patient were reviewed by me and considered in my medical decision making (see chart for details).   This patient is Presenting for  Evaluation of ***, which {Range:23949} require a range of treatment options, and {MDMcomplaint:23950} a complaint that involves a {MDMlevelrisk:23951} risk of morbidity and mortality.  The Differential Diagnoses include***.  Critical Interventions-    Medications - No data to display  Reassessment after intervention:     I *** Additional Historical Information from ***, as the patient is ***.  I decided to review pertinent External Data, and in summary ***.   Clinical Laboratory Tests Ordered, included   Radiologic Tests Ordered, included ***. I independently interpreted the images and agree with radiology interpretation.   Cardiac Monitor Tracing which shows ***   Social Determinants of Health Risk ***  Consult complete with  Medical Decision Making: Summary: ***  Reevaluation with update and discussion with   ***Considered admission***  Patient's presentation is most consistent with {EM COPA:27473}   Disposition:   ____________________________________________  FINAL CLINICAL IMPRESSION(S) / ED DIAGNOSES  Final diagnoses:  None     NEW OUTPATIENT MEDICATIONS STARTED DURING THIS VISIT:  New Prescriptions   No medications on file    Note:  This document was prepared using Dragon voice recognition software and may include unintentional dictation errors.  Nanda Quinton, MD, [facility] Emergency Medicine

## 2022-11-01 NOTE — ED Triage Notes (Signed)
Pt c/o R flank/low back pain r/t trip and fall x3 days ago.  Pain score 10/10 w/ movement.  Pt reports hitting back on a fireplace door and the floor.  Pt was able to walk to triage room w/o assistance.

## 2022-12-05 ENCOUNTER — Encounter: Payer: Self-pay | Admitting: *Deleted

## 2023-03-14 ENCOUNTER — Encounter: Payer: Self-pay | Admitting: *Deleted

## 2023-03-15 ENCOUNTER — Encounter: Payer: Self-pay | Admitting: Family Medicine

## 2023-03-15 ENCOUNTER — Telehealth (INDEPENDENT_AMBULATORY_CARE_PROVIDER_SITE_OTHER): Payer: 59 | Admitting: Family Medicine

## 2023-03-15 VITALS — Ht 65.0 in | Wt 168.0 lb

## 2023-03-15 DIAGNOSIS — L989 Disorder of the skin and subcutaneous tissue, unspecified: Secondary | ICD-10-CM

## 2023-03-15 NOTE — Progress Notes (Signed)
Start time: 9:37 End time: 9:53  Virtual Visit via Video Note  I connected with Hannah Werner on 03/15/23 by a video enabled telemedicine application and verified that I am speaking with the correct person using two identifiers.  Location: Patient: in car, not driving, in [place] Provider: office   I discussed the limitations of evaluation and management by telemedicine and the availability of in person appointments. The patient expressed understanding and agreed to proceed.  History of Present Illness:  Chief Complaint  Patient presents with   Rash    VIRTUAL raised scaly spot under right eye, next to her nose. It is sensitive, odd feeling. Been there for about 3 weeks. Has not tried any medications/creams on it.    3 weeks ago she noticed a scaly patch below R eye, on her face. She noticed it when she put on makeup.  It isn't a pimple, denies any trauma. No oozing, weeping, crusting. Not tender.  Almost slightly itchy. It has gotten more raised, but no overall change in size from when she first noticed it.  She put some aloe on it, thinking if it was a bug bite, but no change.  PMH, PSH, SH reviewed  Outpatient Encounter Medications as of 03/15/2023  Medication Sig Note   Ascorbic Acid (VITAMIN C) 1000 MG tablet Take 1,000 mg by mouth daily.    cholecalciferol (VITAMIN D3) 25 MCG (1000 UNIT) tablet Take 2,000 Units by mouth daily.    Elderberry 575 MG/5ML SYRP Take by mouth daily.    fluticasone furoate-vilanterol (BREO ELLIPTA) 100-25 MCG/ACT AEPB Inhale 1 puff into the lungs daily.    montelukast (SINGULAIR) 10 MG tablet Take 1 tablet (10 mg total) by mouth at bedtime.    sertraline (ZOLOFT) 100 MG tablet Take 1.5 tablets (150 mg total) by mouth at bedtime.    TURMERIC PO Take 1 tablet by mouth daily.    Zinc 50 MG CAPS Take 1 capsule by mouth daily.    albuterol (VENTOLIN HFA) 108 (90 Base) MCG/ACT inhaler Inhale 1-2 puffs into the lungs every 6 (six) hours as needed for  wheezing or shortness of breath. Reported on 01/05/2016 (Patient not taking: Reported on 05/09/2022) 03/15/2023: As needed   diclofenac Sodium (VOLTAREN) 1 % GEL Apply 2 g topically 4 (four) times daily as needed. (Patient not taking: Reported on 03/15/2023) 03/15/2023: As needed   fluticasone (FLONASE) 50 MCG/ACT nasal spray Place 2 sprays into both nostrils daily. (Patient not taking: Reported on 05/09/2022) 03/15/2023: Uses prn, not currently   senna-docusate (SENOKOT-S) 8.6-50 MG tablet Take 1 tablet by mouth at bedtime as needed for mild constipation or moderate constipation. (Patient not taking: Reported on 03/15/2023) 03/15/2023: As needed   [DISCONTINUED] oxyCODONE-acetaminophen (PERCOCET/ROXICET) 5-325 MG tablet Take 1 tablet by mouth every 6 (six) hours as needed for severe pain.    No facility-administered encounter medications on file as of 03/15/2023.   Allergies  Allergen Reactions   Erythromycin Nausea And Vomiting   ROS: no fever, chills, other skin rashes, lesions. No other concerns.   Observations/Objective:  Ht 5\' 5"  (1.651 m)   Wt 168 lb (76.2 kg)   BMI 27.96 kg/m   Well-appearing, pleasant female, in no distress Photo sent by MyChart--not very well focused, difficult to appreciate texture.  Location is on the right side of face, below eye, closer to the nose Slightly raised, ?slightly pigmented lesion (didn't appear to have any pigment in photo). No easily defined borders.    Assessment and Plan:  Skin lesion - limited ability to assess this skin lesion via video and photo.  Encouraged use of HC cream BID for 7-10 d and f/u for in-office eval (vs derm) if not improving   Use hydrocortisone 1% cream twice daily for 7-10 days.  This might help it get flatter, like it initially was ,if there is any component of inflammation. If it continues to grow, change, follow-up in the office for evaluation (vs seeing a dermatologist).   Follow Up Instructions:    I discussed the  assessment and treatment plan with the patient. The patient was provided an opportunity to ask questions and all were answered. The patient agreed with the plan and demonstrated an understanding of the instructions.   The patient was advised to call back or seek an in-person evaluation if the symptoms worsen or if the condition fails to improve as anticipated.  I spent 20 minutes dedicated to the care of this patient, including pre-visit review of records, face to face time, post-visit ordering of testing and documentation.    Lavonda Jumbo, MD

## 2023-03-15 NOTE — Patient Instructions (Signed)
  Use hydrocortisone 1% cream twice daily for 7-10 days.  This might help it get flatter, like it initially was ,if there is any component of inflammation. If it continues to grow, change, follow-up in the office for evaluation (vs seeing a dermatologist).

## 2023-05-24 ENCOUNTER — Encounter: Payer: 59 | Admitting: Family Medicine

## 2023-06-08 ENCOUNTER — Other Ambulatory Visit: Payer: Self-pay | Admitting: Family Medicine

## 2023-06-08 DIAGNOSIS — J45998 Other asthma: Secondary | ICD-10-CM

## 2023-06-08 DIAGNOSIS — F411 Generalized anxiety disorder: Secondary | ICD-10-CM

## 2023-06-08 NOTE — Telephone Encounter (Signed)
Refill request last CPE was 05/09/22 next CPE is 07/11/23. Pt. Had CPE scheduled in July but it looks like it was canceled.

## 2023-07-10 DIAGNOSIS — E559 Vitamin D deficiency, unspecified: Secondary | ICD-10-CM | POA: Insufficient documentation

## 2023-07-10 NOTE — Patient Instructions (Incomplete)
  HEALTH MAINTENANCE RECOMMENDATIONS:  It is recommended that you get at least 30 minutes of aerobic exercise at least 5 days/week (for weight loss, you may need as much as 60-90 minutes). This can be any activity that gets your heart rate up. This can be divided in 10-15 minute intervals if needed, but try and build up your endurance at least once a week.  Weight bearing exercise is also recommended twice weekly.  Eat a healthy diet with lots of vegetables, fruits and fiber.  "Colorful" foods have a lot of vitamins (ie green vegetables, tomatoes, red peppers, etc).  Limit sweet tea, regular sodas and alcoholic beverages, all of which has a lot of calories and sugar.  Up to 1 alcoholic drink daily may be beneficial for women (unless trying to lose weight, watch sugars).  Drink a lot of water.  Calcium recommendations are 1200-1500 mg daily (1500 mg for postmenopausal women or women without ovaries), and vitamin D 1000 IU daily.  This should be obtained from diet and/or supplements (vitamins), and calcium should not be taken all at once, but in divided doses.  Monthly self breast exams and yearly mammograms for women over the age of 16 is recommended.  Sunscreen of at least SPF 30 should be used on all sun-exposed parts of the skin when outside between the hours of 10 am and 4 pm (not just when at beach or pool, but even with exercise, golf, tennis, and yard work!)  Use a sunscreen that says "broad spectrum" so it covers both UVA and UVB rays, and make sure to reapply every 1-2 hours.  Remember to change the batteries in your smoke detectors when changing your clock times in the spring and fall. Carbon monoxide detectors are recommended for your home.  Use your seat belt every time you are in a car, and please drive safely and not be distracted with cell phones and texting while driving.  Yearly flu shot and COVID booster recommended. Consider RSV vaccine (you have to get this one from the pharmacy),  due to having asthma, and >60, if your insurance covers it.

## 2023-07-10 NOTE — Progress Notes (Unsigned)
No chief complaint on file.   Hannah Werner is a 62 y.o. female who presents for a complete physical and follow-up on chronic problems.     Anxiety: She continues on sertraline 150mg .  She is tolerating this dose without side effects.  Moods are better when she can be outside and active, worse when gloomy, rainy and cold.  She had flare of depression 2 years ago, got counseling through [facility], limited news watching, was getting regular exercise and moods improved.  Moods now are good.  UPDATE ***   Asthma and allergies:  Doing well overall. Compliant with Breo and montelukast (switched from Symbicort in the past due to insurance reasons). Denies side effects.  Can't recall the last time she needed to use albuterol as a rescue.  Spirometry was normal in 04/2022. Allergies haven't started flaring yet, not currently using Flonase or antihistamines yet.    High cholesterol:  Lipids were high in 12/2014 when eating a lot of bacon at work (LDL 154). She improved her diet, and rechecks improved.  The last couple of years, LDL has been rising, with LDL back up to 155 on last check in 04/2022. Due for recheck.  Current diet: Red meat Eggs Cheese Butter Creamy dressings/soups/sauces  ***UPDATE  Component Ref Range & Units 1 yr ago 2 yr ago 3 yr ago 4 yr ago 5 yr ago 6 yr ago 7 yr ago  Cholesterol, Total 100 - 199 mg/dL 409 High  811 High  914 High  209 High  197    Triglycerides 0 - 149 mg/dL 782 956 213 88 98 086 R 124 R  HDL >39 mg/dL 71 65 71 67 61 66 R 68 R  VLDL Cholesterol Cal 5 - 40 mg/dL 19 20 22 18 20     LDL Chol Calc (NIH) 0 - 99 mg/dL 578 High  469 High  629 High       Chol/HDL Ratio 0.0 - 4.4 ratio 3.5 3.4 CM 3.0 CM 3.1 CM 3.2 CM 3.2 R 3.2 R   H/o Vitamin D deficiency.  Last level was 34.2 in 04/2022, when taking 2000 IU.  Level was 27.6 in October 2019 when forgetting to take it frequently.  She is currently taking 2000 IU daily.  She bumped it up to 5000 IU over the  winter.   Immunization History  Administered Date(s) Administered   DT (Pediatric) 04/19/2000   Influenza Split 08/14/2011, 07/30/2014   Influenza, Seasonal, Injecte, Preservative Fre 12/23/2012   Influenza,inj,Quad PF,6+ Mos 07/30/2018, 07/10/2022   Influenza-Unspecified 08/01/2013, 08/29/2019   Janssen (J&J) SARS-COV-2 Vaccination 01/27/2020   Pneumococcal Polysaccharide-23 04/05/2011, 05/09/2022   Tdap 04/05/2011, 01/27/2021   Zoster Recombinant(Shingrix) 05/09/2022, 07/10/2022   Zoster, Live 11/06/2013   Last Pap smear:  12/2020, no high risk HPV present Last mammogram:  09/2022 Last colonoscopy: 08/2022 Dr.Outlaw-tubular adenomas x 3, int hemorrhoids  Last DEXA: never   Dentist: twice early   Ophtho: yearly   Exercise:   Doing an exercise challenge at work; does video workouts for abs and with bands (daily).  Does some weights when she goes to the gym (once/week). Long dog walks (45 mins) when not too hot, 3-4x/week.   Had HIV, RPR and Hepatitis screening done in 07/2005.    Husband had Hep C, completed treatment and counts have been negative. She states it never came back.  Last Hep C screening was 2016.   PMH, PSH, SH and FH reviewed and updated    ROS: The patient  denies anorexia, fever, headaches, vision changes, ear pain, sore throat, breast concerns, chest pain, dizziness, syncope, dyspnea on exertion, swelling, nausea, vomiting, diarrhea, abdominal pain, melena, hematochezia, hematuria, incontinence, dysuria, vaginal bleeding, discharge, odor or itch, genital lesions, numbness, weakness, tremor, suspicious skin lesions, depression, abnormal bleeding/bruising, or enlarged lymph nodes.   Pain and stiffness at index fingers bilaterally, mild and unchanged. Denies memory concerns. Anxiety and depression are doing well on current meds. Some hearing loss and tinnitus in the right ear. Unchanged from last year   PHYSICAL EXAM:  There were no vitals taken for this  visit.  Wt Readings from Last 3 Encounters:  03/15/23 168 lb (76.2 kg)  11/01/22 169 lb (76.7 kg)  05/09/22 169 lb 9.6 oz (76.9 kg)    General Appearance:    Alert, cooperative, no distress, appears stated age     Head:    Normocephalic, without obvious abnormality, atraumatic     Eyes:    PERRL, conjunctiva/corneas clear, EOM's intact, fundi benign     Ears:    Normal TM's and external ear canals  Nose:    Normal, no drainage   Throat:    Normal, no lesions  Neck:    Supple, no lymphadenopathy; thyroid: no enlargement/tenderness/ nodules; no carotid bruit or JVD     Back:    Spine nontender, no curvature, ROM normal, no CVA tenderness     Lungs:    Clear to auscultation bilaterally without rales or ronchi; respirations unlabored.  Chest Wall:    No tenderness or deformity     Heart:    Regular rate and rhythm, S1 and S2 normal, no murmur, rub or gallop     Breast Exam:    No tenderness, masses, or nipple discharge or inversion. No axillary lymphadenopathy     Abdomen:    Soft, non-tender, nondistended, normoactive bowel sounds, no masses, no hepatosplenomegaly     Genitalia:    Normal external genitalia without lesions; very mild atrophic changes. BUS and vagina normal; no cervical motion tenderness.  No abnormal vaginal discharge. Uterus and adnexa not enlarged, nontender, no masses. Pap not performed.   Rectal:    Normal tone, no masses or tenderness; guaiac negative stool. Inflamed external hemorrhoid, nontender  Extremities:    No clubbing, cyanosis or edema. Arthritic changes to DIPs of bilateral index fingers.   Pulses:    2+ and symmetric all extremities     Skin:    Skin color, texture, turgor normal.  Lymph nodes:    Cervical, supraclavicular, and axillary nodes normal     Neurologic:    Normal strength, sensation and gait; reflexes 2+ and symmetric throughout                      Psych:    Normal mood, affect, hygiene and grooming    ***UPDATE if hemorrhoid inflamed       05/09/2022    8:41 AM 01/27/2021    9:26 AM 01/26/2020    9:18 AM 03/20/2019   11:55 AM  GAD 7 : Generalized Anxiety Score  Nervous, Anxious, on Edge 1 1 1 1   Control/stop worrying 0 0 0 0  Worry too much - different things 0 0 0 0  Trouble relaxing 0 0 0 1  Restless 0 1 0 0  Easily annoyed or irritable 0 0 1 1  Afraid - awful might happen 0 0 0 0  Total GAD 7 Score 1 2 2 3   Anxiety  Difficulty Not difficult at all          ASSESSMENT/PLAN:  Fall screen--She had 1 fall, with rib fracture, 10/2022.  Breo RF? Looks like others were filled x90d within the last month.  GAD-7  Flu shot (missed it at work in the past, does she want to get here? If not getting here, decline, even if she plans to get elsewhere, right??) COVID booster (or decline if she doesn't want)  Discussed monthly self breast exams and yearly mammograms; at least 30 minutes of aerobic activity at least 5 days/week ,weight-bearing exercise 2x/wk; proper sunscreen use reviewed; healthy diet, including goals of calcium and vitamin D intake and alcohol recommendations (less than or equal to 1 drink/day) reviewed; regular seatbelt use; changing batteries in smoke detectors.  Immunization recommendations discussed-- yearly flu shots recommended COVID booster recommended Consider RSV vaccine (>60, with asthma), from the pharmacy, if covered by her insurance. Colonoscopy recommendations reviewed--UTD   F/u 1 year, sooner prn

## 2023-07-11 ENCOUNTER — Ambulatory Visit (INDEPENDENT_AMBULATORY_CARE_PROVIDER_SITE_OTHER): Payer: 59 | Admitting: Family Medicine

## 2023-07-11 ENCOUNTER — Encounter: Payer: Self-pay | Admitting: Family Medicine

## 2023-07-11 VITALS — BP 124/76 | HR 68 | Ht 64.75 in | Wt 165.0 lb

## 2023-07-11 DIAGNOSIS — Z23 Encounter for immunization: Secondary | ICD-10-CM | POA: Diagnosis not present

## 2023-07-11 DIAGNOSIS — J45998 Other asthma: Secondary | ICD-10-CM | POA: Diagnosis not present

## 2023-07-11 DIAGNOSIS — E559 Vitamin D deficiency, unspecified: Secondary | ICD-10-CM

## 2023-07-11 DIAGNOSIS — F411 Generalized anxiety disorder: Secondary | ICD-10-CM

## 2023-07-11 DIAGNOSIS — E782 Mixed hyperlipidemia: Secondary | ICD-10-CM

## 2023-07-11 DIAGNOSIS — Z0000 Encounter for general adult medical examination without abnormal findings: Secondary | ICD-10-CM

## 2023-07-11 DIAGNOSIS — N952 Postmenopausal atrophic vaginitis: Secondary | ICD-10-CM

## 2023-07-11 LAB — POCT URINALYSIS DIP (PROADVANTAGE DEVICE)
Bilirubin, UA: NEGATIVE
Blood, UA: NEGATIVE
Glucose, UA: NEGATIVE mg/dL
Ketones, POC UA: NEGATIVE mg/dL
Leukocytes, UA: NEGATIVE
Nitrite, UA: NEGATIVE
Protein Ur, POC: NEGATIVE mg/dL
Specific Gravity, Urine: 1.01
Urobilinogen, Ur: 0.2
pH, UA: 6 (ref 5.0–8.0)

## 2023-07-11 MED ORDER — ESTROGENS CONJUGATED 0.625 MG/GM VA CREA: 1.0000 | TOPICAL_CREAM | VAGINAL | 11 refills | Status: AC

## 2023-07-11 MED ORDER — FLUTICASONE FUROATE-VILANTEROL 100-25 MCG/ACT IN AEPB: 1.0000 | INHALATION_SPRAY | Freq: Every day | RESPIRATORY_TRACT | 11 refills | Status: DC

## 2023-07-12 LAB — CMP14+EGFR
ALT: 21 IU/L (ref 0–32)
AST: 18 IU/L (ref 0–40)
Albumin: 4.7 g/dL (ref 3.9–4.9)
Alkaline Phosphatase: 111 IU/L (ref 44–121)
BUN/Creatinine Ratio: 14 (ref 12–28)
BUN: 10 mg/dL (ref 8–27)
Bilirubin Total: 0.4 mg/dL (ref 0.0–1.2)
CO2: 24 mmol/L (ref 20–29)
Calcium: 10.2 mg/dL (ref 8.7–10.3)
Chloride: 100 mmol/L (ref 96–106)
Creatinine, Ser: 0.72 mg/dL (ref 0.57–1.00)
Globulin, Total: 2.1 g/dL (ref 1.5–4.5)
Glucose: 94 mg/dL (ref 70–99)
Potassium: 5 mmol/L (ref 3.5–5.2)
Sodium: 138 mmol/L (ref 134–144)
Total Protein: 6.8 g/dL (ref 6.0–8.5)
eGFR: 95 mL/min/{1.73_m2} (ref >59)

## 2023-07-12 LAB — CBC WITH DIFFERENTIAL/PLATELET
Basophils Absolute: 0.1 10*3/uL (ref 0.0–0.2)
Basos: 1 %
EOS (ABSOLUTE): 0.2 10*3/uL (ref 0.0–0.4)
Eos: 3 %
Hematocrit: 41.8 % (ref 34.0–46.6)
Hemoglobin: 13.8 g/dL (ref 11.1–15.9)
Immature Grans (Abs): 0 10*3/uL (ref 0.0–0.1)
Immature Granulocytes: 0 %
Lymphocytes Absolute: 1.6 10*3/uL (ref 0.7–3.1)
Lymphs: 25 %
MCH: 30.3 pg (ref 26.6–33.0)
MCHC: 33 g/dL (ref 31.5–35.7)
MCV: 92 fL (ref 79–97)
Monocytes Absolute: 0.4 10*3/uL (ref 0.1–0.9)
Monocytes: 6 %
Neutrophils Absolute: 4.3 10*3/uL (ref 1.4–7.0)
Neutrophils: 65 %
Platelets: 263 10*3/uL (ref 150–450)
RBC: 4.55 x10E6/uL (ref 3.77–5.28)
RDW: 12 % (ref 11.7–15.4)
WBC: 6.5 10*3/uL (ref 3.4–10.8)

## 2023-07-12 LAB — LIPID PANEL
Chol/HDL Ratio: 3 ratio (ref 0.0–4.4)
Cholesterol, Total: 251 mg/dL — ABNORMAL HIGH (ref 100–199)
HDL: 83 mg/dL (ref >39)
LDL Chol Calc (NIH): 147 mg/dL — ABNORMAL HIGH (ref 0–99)
Triglycerides: 121 mg/dL (ref 0–149)
VLDL Cholesterol Cal: 21 mg/dL (ref 5–40)

## 2023-07-18 ENCOUNTER — Encounter: Payer: 59 | Admitting: Family Medicine

## 2023-10-10 LAB — HM MAMMOGRAPHY

## 2023-12-06 ENCOUNTER — Other Ambulatory Visit: Payer: Self-pay | Admitting: Family Medicine

## 2023-12-06 DIAGNOSIS — F411 Generalized anxiety disorder: Secondary | ICD-10-CM

## 2024-03-05 ENCOUNTER — Other Ambulatory Visit: Payer: Self-pay | Admitting: Family Medicine

## 2024-03-05 DIAGNOSIS — J45998 Other asthma: Secondary | ICD-10-CM

## 2024-05-08 ENCOUNTER — Telehealth: Payer: Self-pay

## 2024-05-08 ENCOUNTER — Other Ambulatory Visit (HOSPITAL_COMMUNITY): Payer: Self-pay

## 2024-05-08 DIAGNOSIS — J45998 Other asthma: Secondary | ICD-10-CM

## 2024-05-08 NOTE — Telephone Encounter (Signed)
 Pharmacy Patient Advocate Encounter   Received notification from CoverMyMeds that prior authorization for Fluticasone  Furoate-Vilanterol 100-25MCG/ACT aerosol powder is required/requested.   Insurance verification completed.   The patient is insured through [facility] .   Per test claim: PA required; PA submitted to above mentioned insurance via CoverMyMeds Key/confirmation #/EOC (Key: [id number])      Status is pending

## 2024-05-12 ENCOUNTER — Telehealth: Payer: Self-pay

## 2024-05-12 ENCOUNTER — Other Ambulatory Visit (HOSPITAL_COMMUNITY): Payer: Self-pay

## 2024-05-12 MED ORDER — BREO ELLIPTA 100-25 MCG/ACT IN AEPB: 1.0000 | INHALATION_SPRAY | Freq: Every day | RESPIRATORY_TRACT | 11 refills | Status: DC

## 2024-05-12 NOTE — Telephone Encounter (Signed)
 Sent in as a DAW

## 2024-05-12 NOTE — Telephone Encounter (Signed)
 Pharmacy Patient Advocate Encounter  Insurance verification completed.   The patient is insured through [facility]   Ran test claim for BREO. Currently a quantity of 60 is a 30 day supply and the co-pay is 58.29 .   This test claim was processed through [facility]- copay amounts may vary at other pharmacies due to pharmacy/plan contracts, or as the patient moves through the different stages of their insurance plan.

## 2024-05-12 NOTE — Telephone Encounter (Signed)
 Hi Dr Randol,                              We have received a Prior Authorization request for Fluticasone  Furoate-Vilanterol 100-25MCG/ACT aerosol powder. However the pts plan will pickup most of the cost of the rx if the drug is written as brand/Breo with a daw 1. Please advise, and if appropriate could you resend back to the pts pref'd pharmacy.

## 2024-05-16 MED ORDER — BREO ELLIPTA 100-25 MCG/ACT IN AEPB: 1.0000 | INHALATION_SPRAY | Freq: Every day | RESPIRATORY_TRACT | 11 refills | Status: AC

## 2024-06-02 ENCOUNTER — Ambulatory Visit: Payer: Self-pay

## 2024-06-02 NOTE — Telephone Encounter (Signed)
 FYI Only or Action Required?: FYI only for provider.  Patient was last seen in primary care on 07/11/2023 by Randol Dawes, MD.  Called Nurse Triage reporting Animal Bite.  Symptoms began 05/09/24.  Interventions attempted: Other: Cleaned and bandaged.  Symptoms are: gradually improving.  Triage Disposition: See Physician Within 24 Hours (Earliest appointment scheduled for 8/6)  Patient/caregiver understands and will follow disposition?: Yes     Copied from CRM #[id number]. Topic: Clinical - Red Word Triage >> Jun 02, 2024  3:23 PM Fredrica W wrote: Red Word that prompted transfer to Nurse Triage: dog bite , hand knuckles extremely painful, swollen      Reason for Disposition  [1] Last tetanus shot > 5 years ago AND [2] any break in skin (e.g., cut, puncture or scratch)    Last tetanus in 2022, puncture wounds are closed but there is still swelling and tenderness present, appointment made for 8/6  Answer Assessment - Initial Assessment Questions 1. APPEARANCE What does it look like?  (e.g., abrasion, bruise, puncture)      Wound has closed but left hand middle finger, left hand index finger, and right hand pinky finger are all swollen   3. LOCATION: Where is the bite located?      Bilateral hands  4. ONSET: When did the bite happen? (e.g., minutes, hours ago)      7/11 5. ANIMAL: What type of animal caused the bite? Is the injury from a bite or a claw? If the animal is a dog or a cat, ask: Was it a pet or a stray? Was it acting ill or behaving strangely?     Dogs, pets 6. RABIES VACCINE: For dog or cat bites, ask: Do you know if the pet is vaccinated against rabies?  (e.g., yes, no, overdue for rabies shot, unknown)     Vaccinated 7. CIRCUMSTANCES: Tell me how this happened.      Was breaking up a fight between two of her dogs  8. TETANUS: When was your last tetanus booster?     01/27/2021  Protocols used: Animal Bite-A-AH

## 2024-06-04 ENCOUNTER — Ambulatory Visit
Admission: RE | Admit: 2024-06-04 | Discharge: 2024-06-04 | Disposition: A | Discharge status: Home / Self Care | Source: Ambulatory Visit | Attending: Medical | Admitting: Medical

## 2024-06-04 ENCOUNTER — Ambulatory Visit: Admitting: Medical

## 2024-06-04 VITALS — BP 120/80 | HR 64 | Wt 162.8 lb

## 2024-06-04 DIAGNOSIS — M25642 Stiffness of left hand, not elsewhere classified: Secondary | ICD-10-CM

## 2024-06-04 DIAGNOSIS — M79641 Pain in right hand: Secondary | ICD-10-CM | POA: Diagnosis not present

## 2024-06-04 DIAGNOSIS — M79644 Pain in right finger(s): Secondary | ICD-10-CM

## 2024-06-04 DIAGNOSIS — W540XXA Bitten by dog, initial encounter: Secondary | ICD-10-CM | POA: Diagnosis not present

## 2024-06-04 DIAGNOSIS — T148XXA Other injury of unspecified body region, initial encounter: Secondary | ICD-10-CM

## 2024-06-04 DIAGNOSIS — M79645 Pain in left finger(s): Secondary | ICD-10-CM

## 2024-06-04 DIAGNOSIS — M79642 Pain in left hand: Secondary | ICD-10-CM

## 2024-06-04 NOTE — Progress Notes (Signed)
 Subjective:  Hannah Werner is a 63 y.o. female who presents for Chief Complaint  Patient presents with   Acute Visit    Dog bite on 7/11, left middle finger pain and swelling,      Here for follow up from dog bite.  She has 2 dogs that don't like each other and has to keep them separated.   The day she got bit, her husband was in the hospital.  She was distracted dealing with husbands issues on the phone when one dog got loose from his kennel and attacked the other dog.   She reached in to get the attacking dog away.  Her dog ended up biting her on left index and middle finger, abdomen, both forearms.   She also sustained bruising of her right hand punching the dog to get it off.  Date of injury 05/09/24  That day she cleaned wounds really good, soaked in epsom salts.  Bandaged them up on her own.  This is her first visit in regards to this injury  Her dogs are up to date on vaccines.  Currently she notes left middle finger with pain and knuckle on right hand.    Even flipping on turn signal causes shooting pains in finger.    No numbness or tinging.   Right handed  No other aggravating or relieving factors.    No other c/o.  The following portions of the patient's history were reviewed and updated as appropriate: allergies, current medications, past family history, past medical history, past social history, past surgical history and problem list.  ROS Otherwise as in subjective above    Objective: BP 120/80   Pulse 64   Wt 162 lb 12.8 oz (73.8 kg)   BMI 27.30 kg/m   General appearance: alert, no distress, well developed, well nourished Right hand with some localized swelling over the fifth MCP, tender at that area and just proximal to that at the distal fifth metacarpal, but finger range of motion normal, right hand otherwise nontender. Left hand quite tender over the third finger DIP and distal phalanx but worse at the bend of the volar surface of the DIP.  There is also  a healing thickened skin on the volar DIP of the left third finger. Left hand index finger with some thickening healing skin from wound over the PIP and DIP anterior and posterior surface, tender over the PIP and DIP index finger but mild tenderness compared to the more intense tenderness of the third middle finger at the DIP anterior surface Strength and sensation appears to be normal of fingers and hand in general Pulses and capillary refill normal both hands Left lower abdomen with about a 2 cm round area of purplish bruising, no open skin or wound              Assessment: Encounter Diagnoses  Name Primary?   Pain in both hands Yes   Dog bite, initial encounter    Pain in finger of both hands    Bruising    Decreased range of motion of finger of left hand      Plan: The dog that bit her was her own dog that is up-to-date on vaccines.  She has 2 dogs that do not get along and she keeps them separated.  We will send for x-rays given the type of injury to both hands and the tenderness locally of both hands, left index and middle finger DIP and right fifth metacarpal tenderness and swelling.  We discussed that she seems to have intact tendons, normal strength of the fingers.  We discussed that she definitely has we discussed that she definitely has had some soft tissue injury of the index and middle finger and right fifth MCP area that will take some time to heal.  We discussed the possibility of referral to orthopedics and possibility of occupational therapy.  Advise she use some Aleve  ibuprofen  daily for the next 5 to 7 days  Advised using moleskin and Coban to wrap and pad the right hand or possibly splint pending x-ray.  I did place a rigid padded metal splint onto the left middle finger and buddy tape to the index finger today.  Advise she use splinting to protect the finger and to splint the finger for the next 3 to 5 days  Continue to keep fingers clean with soap and  water but there is no obvious sign of infection or open wound today.  The picture she has from the date of injury suggest that she probably would have benefited from sutures that day.  Last tdap tetanus 2022.  Jamaira was seen today for acute visit.  Diagnoses and all orders for this visit:  Pain in both hands -     DG Hand Complete Right; Future -     DG Finger Middle Left  Dog bite, initial encounter -     DG Hand Complete Right; Future -     DG Finger Middle Left  Pain in finger of both hands -     DG Hand Complete Right; Future -     DG Finger Middle Left  Bruising  Decreased range of motion of finger of left hand -     DG Hand Complete Right; Future -     DG Finger Middle Left    Follow up: pending xray

## 2024-06-04 NOTE — Patient Instructions (Signed)
 Please go to [facility] for your hand/finger xray.   Their hours are 8am - 4:30 pm Monday - Friday.  Take your insurance card with you.  [facility] [phone]  [address] [place], [place] [address]

## 2024-06-13 ENCOUNTER — Ambulatory Visit: Payer: Self-pay | Admitting: Medical

## 2024-06-13 ENCOUNTER — Other Ambulatory Visit: Payer: Self-pay

## 2024-06-13 DIAGNOSIS — S62665A Nondisplaced fracture of distal phalanx of left ring finger, initial encounter for closed fracture: Secondary | ICD-10-CM

## 2024-06-13 NOTE — Progress Notes (Signed)
 Please call about results.  There is actually a nondisplaced fracture or break in the bone of the third finger distal digit.  There is some arthritis in general of the hands.  Given the fracture, that makes sense with her pain.  The question is whether or not she did any other damage that needs further treatment.  I would recommend referral to orthopedics for further evaluation and discussion.  If agreeable we can refer to hand surgery.  How is she feeling currently with her hand and fingers?

## 2024-06-13 NOTE — Telephone Encounter (Signed)
 Spoke to pt informed of results per Shane's notes. She is agreeable with referral.  She states it feels some better but still having issues

## 2024-06-23 ENCOUNTER — Ambulatory Visit (INDEPENDENT_AMBULATORY_CARE_PROVIDER_SITE_OTHER): Admitting: Orthopedic Surgery

## 2024-06-23 ENCOUNTER — Other Ambulatory Visit (INDEPENDENT_AMBULATORY_CARE_PROVIDER_SITE_OTHER)

## 2024-06-23 DIAGNOSIS — S62663A Nondisplaced fracture of distal phalanx of left middle finger, initial encounter for closed fracture: Secondary | ICD-10-CM | POA: Diagnosis not present

## 2024-06-23 DIAGNOSIS — M79645 Pain in left finger(s): Secondary | ICD-10-CM

## 2024-06-23 NOTE — Progress Notes (Signed)
 Tula Schryver - 63 y.o. female MRN [id number]  Date of birth: 11-07-1960  Office Visit Note: Visit Date: 06/23/2024 PCP: Randol Dawes, MD Referred by: Randol Dawes, MD  Subjective: No chief complaint on file.  HPI: Faylinn Schwenn is a pleasant 63 y.o. female who presents today for evaluation of a left long finger distal phalanx fracture sustained approximate 6 weeks prior.  She was breaking up her dogs fighting at that time.  She did not seek care initially, has been seen by her family practice doctor approximate 3 weeks prior who performed x-rays of the hand and left long finger which showed subacute distal phalanx fracture without significant displacement.  She states that her symptoms have improved significantly, pain is well-controlled and she is using her left hand without significant restriction.  Also does have some ongoing soreness at the right small finger MCP region with associated swelling, underwent recent hand x-rays which were negative for fracture or dislocation.  Pertinent ROS were reviewed with the patient and found to be negative unless otherwise specified above in HPI.   Visit Reason: fx 3rd left digit- breaking up her dogs fighting  Duration of symptoms: since 7/11 Hand dominance: right Occupation: computer based Diabetic: No Smoking: No Heart/Lung History: asthma Blood Thinners: none  Prior Testing/EMG: x-ray Injections (Date): none Treatments: ice Prior Surgery: none  Assessment & Plan: Visit Diagnoses:  1. Closed nondisplaced fracture of distal phalanx of left middle finger, initial encounter   2. Pain in left finger(s)     Plan: Extensive discussion was held the patient today regarding her left long finger distal phalanx fracture.  I reviewed the results of her prior x-rays as well as the comparison studies from today which demonstrate subacute distal phalanx fracture with appropriate interval healing.  This correlates with her clinical examination today  which is fairly benign.  She is able to perform appropriate range of motion of the left long finger without significant restriction, no evidence of ongoing infection.  At this juncture, she can move forward activities as tolerated, does not need any significant immobilization of the left long finger at this juncture.  As for the right hand, I reviewed the results of her previous x-rays which were negative, she can also continue with activities as tolerated on the side.   Follow-up: No follow-ups on file.   Meds & Orders: No orders of the defined types were placed in this encounter.   Orders Placed This Encounter  Procedures   XR Finger Middle Left     Procedures: No procedures performed      Clinical History: No specialty comments available.  She reports that she has never smoked. She has never used smokeless tobacco. No results for input(s): HGBA1C, LABURIC in the last 8760 hours.  Objective:   Vital Signs: There were no vitals taken for this visit.  Physical Exam  Gen: Well-appearing, in no acute distress; non-toxic CV: Regular Rate. Well-perfused. Warm.  Resp: Breathing unlabored on room air; no wheezing. Psych: Fluid speech in conversation; appropriate affect; normal thought process  Ortho Exam Left hand - Mild tenderness over the distal phalanx, able to perform flexion extension at the DIP without restriction, sensation is intact distally, nail plate is well secured beneath the eponychial fold  Right hand: - Moderate tenderness over the small finger MCP region with associated swelling, no evidence of extensor tendon instability when isolated, able to perform composite fist with appropriate grip strength  Imaging: XR Finger Middle Left Result Date: 06/23/2024  X-ray of the left long finger demonstrate previously known distal phalanx fracture, transverse in nature.  There is evidence of interval healing.  DIP joint remains well located in all planes.   Past  Medical/Family/Surgical/Social History: Medications & Allergies reviewed per EMR, new medications updated. Patient Active Problem List   Diagnosis Date Noted   Vitamin D  deficiency 07/10/2023   Impaired fasting glucose 07/29/2018   Mixed hyperlipidemia 01/05/2016   Family history of colon cancer 01/01/2014   Pure hyperglyceridemia 04/05/2011   Anxiety state 04/05/2011   Allergic rhinitis 04/05/2011   Asthma in remission 04/05/2011   Past Medical History:  Diagnosis Date   Adenomatous colon polyp 05/2015   tubular adenoma (Dr. Burnette); repeat 05/2020   Allergy    seasonal   Anxiety    Asthma    Exposure to hepatitis C    husband   Family hx of colon cancer    mom   Pure hyperglyceridemia    Unspecified vitamin D  deficiency 05/2009   Family History  Problem Relation Age of Onset   Diabetes Mother    Cancer Mother 76       colon cancer   Colon cancer Mother 16   Heart disease Father        CABG @69    Hyperlipidemia Father    Anxiety disorder Father    Cancer Father        CLL   Diabetes Sister    Diabetes Sister    Seizures Sister    Heart disease Maternal Uncle        MI at 37 or 17   Stroke Maternal Grandfather    Asthma Neg Hx    Past Surgical History:  Procedure Laterality Date   CHOLECYSTECTOMY  41   COLONOSCOPY  08/2009, 05/2015   Social History   Occupational History   Occupation: IT sales professional: [employer]   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 [facility], Hand Surgery

## 2024-07-16 NOTE — Patient Instructions (Incomplete)

## 2024-07-16 NOTE — Progress Notes (Deleted)
 No chief complaint on file.   Hannah Werner is a 63 y.o. female who presents for a complete physical and follow-up on chronic problems.     She was bitten by one of her dogs trying to break up a fight between her 2 dogs. This occurred on 7/11. She presented for evaluation on 8/6, and x-rays showed fracture of L 3rd distal phalanx. She saw ortho in f/u and healing was noted on XR  Atrophic vaginitis:  Last year she reported vaginal dryness and irritation.  She was prescribed premarin  cream to use 2x/week.  ***UPDATE   Anxiety: She continues on sertraline  150mg .  She is tolerating this dose without side effects.  Moods are better when she can be outside and active, worse when gloomy, rainy and cold.  Moods have been good.   Asthma and allergies:  Doing well overall. Compliant with Breo and montelukast . Denies side effects.  Can't recall the last time she needed to use albuterol  as a rescue.  Spirometry was normal in 04/2022. Allergies haven't started flaring yet, not currently using Flonase  or antihistamines yet.   High cholesterol:  Lipids were high in 12/2014 when eating a lot of bacon at work (LDL 154). She improved her diet, and rechecks improved.  The last couple of years, LDL has been rising, with LDL 155 in 04/2022 and 147 on last check last year. Due for recheck.  Current diet: Red meat 3x/week (lamb, burger, occ steak, very little bacon) Eggs 4-5/week Cheese every other day (Mozzarella, pepper jack, not lowfat). Butter--on bread/toast. Creamy dressings/soups/sauces--none  Component Ref Range & Units (hover) 1 yr ago 2 yr ago 3 yr ago 4 yr ago 5 yr ago 6 yr ago 7 yr ago  Cholesterol, Total 251 High  245 High  218 High  212 High  209 High  197   Triglycerides 121 110 111 124 88 98 141 R  HDL 83 71 65 71 67 61 66 R  VLDL Cholesterol Cal 21 19 20 22 18 20    LDL Chol Calc (NIH) 147 High  155 High  133 High  119 High      Chol/HDL Ratio 3.0 3.5 CM 3.4 CM 3.0 CM 3.1 CM 3.2 CM 3.2 R     H/o Vitamin D  deficiency.  Last level was 34.2 in 04/2022, when taking 2000 IU.  Level was 27.6 in October 2019 when forgetting to take it frequently.  She is currently taking 2000 IU daily.  She bumps it up to 5000 IU over the winter.  ***UPDATE currently taking, increased over winter?  Component Ref Range & Units (hover) 2 yr ago (05/09/22) 5 yr ago (01/20/19) 5 yr ago (07/30/18) 6 yr ago (01/21/18) 7 yr ago (01/10/17) 8 yr ago (01/05/16) 10 yr ago (01/01/14)  Vit D, 25-Hydroxy 34.2 31.1 CM 27.6 Low  CM 25.7 Low  CM 32 R, CM 21 Low  R, CM 36 R, CM     Immunization History  Administered Date(s) Administered   DT (Pediatric) 04/19/2000   Influenza Split 08/14/2011, 07/30/2014   Influenza, Seasonal, Injecte, Preservative Fre 12/23/2012, 07/11/2023   Influenza,inj,Quad PF,6+ Mos 07/30/2018, 07/10/2022   Influenza-Unspecified 08/01/2013, 08/29/2019   Janssen (J&J) SARS-COV-2 Vaccination 01/27/2020   Pneumococcal Polysaccharide-23 04/05/2011, 05/09/2022   Tdap 04/05/2011, 01/27/2021   Zoster Recombinant(Shingrix ) 05/09/2022, 07/10/2022   Zoster, Live 11/06/2013   Last Pap smear:  12/2020, no high risk HPV present Last mammogram:  09/2023 Last colonoscopy: 08/2022 Dr.Outlaw-tubular adenomas x 3, int hemorrhoids. Pt states  he wanted her to repeat in 2-3 years (not 5). Last DEXA: never   Dentist: twice early   Ophtho: yearly   Exercise:    Long, brisk dog walks (40 mins)  3-4x/week (disc golf course) Gym once a week--light weights, elliptical. Exercise was limited since June related to husband's stroke, lots of appointments. (Last year) Fractured hand July   Had HIV, RPR and Hepatitis screening done in 07/2005.    Husband had Hep C, completed treatment and counts have been negative. She states it never came back.  Last Hep C screening was 2016.   PMH, PSH, SH and FH reviewed and updated     ROS: The patient denies anorexia, fever, headaches, vision changes, ear pain, sore  throat, breast concerns, chest pain, dizziness, syncope, dyspnea on exertion, swelling, nausea, vomiting, diarrhea, abdominal pain, melena, hematochezia, hematuria, incontinence, dysuria, vaginal bleeding, discharge, odor or itch, genital lesions, numbness, weakness, tremor, suspicious skin lesions, depression, abnormal bleeding/bruising, or enlarged lymph nodes.   Pain and stiffness at index fingers bilaterally, mild and unchanged. Denies memory concerns. Anxiety and depression are doing well on current meds. Some hearing loss and tinnitus in the right ear. Unchanged from last year Vaginal dryness/irritation per HPI   PHYSICAL EXAM:  There were no vitals taken for this visit.  Wt Readings from Last 3 Encounters:  06/04/24 162 lb 12.8 oz (73.8 kg)  07/11/23 165 lb (74.8 kg)  03/15/23 168 lb (76.2 kg)    General Appearance:    Alert, cooperative, no distress, appears stated age     Head:    Normocephalic, without obvious abnormality, atraumatic     Eyes:    PERRL, conjunctiva/corneas clear, EOM's intact, fundi benign     Ears:    Normal TM's and external ear canals  Nose:    Normal, no drainage   Throat:    Normal, no lesions  Neck:    Supple, no lymphadenopathy; thyroid: no enlargement/tenderness/ nodules; no carotid bruit or JVD     Back:    Spine nontender, no curvature, ROM normal, no CVA tenderness     Lungs:    Clear to auscultation bilaterally without rales or ronchi; respirations unlabored.  Chest Wall:    No tenderness or deformity     Heart:    Regular rate and rhythm, S1 and S2 normal, no murmur, rub or gallop     Breast Exam:    No tenderness, masses, or nipple discharge or inversion. No axillary lymphadenopathy     Abdomen:    Soft, non-tender, nondistended, normoactive bowel sounds, no masses, no hepatosplenomegaly     Genitalia:    Normal external genitalia without lesions; very mild atrophic changes. BUS and vagina normal; no cervical motion tenderness.  No abnormal  vaginal discharge. Uterus and adnexa not enlarged, nontender, no masses. Pap not performed.   Rectal:    Normal tone, no masses or tenderness; guaiac negative stool. Non-inflamed external hemorrhoid, nontender  Extremities:    No clubbing, cyanosis or edema. Arthritic changes to DIPs of bilateral index fingers.   Pulses:    2+ and symmetric all extremities     Skin:    Skin color, texture, turgor normal. 2 linear scars at lateral R low back/hip.    Lymph nodes:    Cervical, supraclavicular, and axillary nodes normal     Neurologic:    Normal strength, sensation and gait; reflexes 3+ and symmetric throughout  Psych:    Normal mood, affect, hygiene and grooming    ***UPDATE--scars on back.  FINGER exam (L middle)     07/11/2023    2:12 PM 05/09/2022    8:41 AM 01/27/2021    9:26 AM 01/26/2020    9:18 AM  GAD 7 : Generalized Anxiety Score  Nervous, Anxious, on Edge 0 1 1 1   Control/stop worrying 0 0 0 0  Worry too much - different things 0 0 0 0  Trouble relaxing 0 0 0 0  Restless 0 0 1 0  Easily annoyed or irritable 0 0 0 1  Afraid - awful might happen 0 0 0 0  Total GAD 7 Score 0 1 2 2   Anxiety Difficulty Not difficult at all Not difficult at all          07/11/2023    2:11 PM 05/09/2022    8:41 AM 01/27/2021    8:29 AM 01/26/2020    8:36 AM 03/20/2019   11:06 AM  Depression screen PHQ 2/9  Decreased Interest 0 0 0 0 0  Down, Depressed, Hopeless 0 0 0 0 0  PHQ - 2 Score 0 0 0 0 0      ASSESSMENT/PLAN:  GAD-7, phq-2/9 Add D if not taking 2000 daily, or if didn't increase dose over winter. Last TSH in 2019. Add if any fatigue or other sx.  Flu, Prevnar Did she ever gets RSV vaccine?  [facility] refilled Breo for a full year in July (?!?!)  COVID rec--novavax from pharm if doesn't want mRNA vaccine  Discuss calcium score, if LDL remains elevated   Discussed monthly self breast exams and yearly mammograms; at least 30 minutes of aerobic activity at  least 5 days/week, weight-bearing exercise 2x/wk; proper sunscreen use reviewed; healthy diet, including goals of calcium and vitamin D  intake and alcohol recommendations (less than or equal to 1 drink/day) reviewed; regular seatbelt use; changing batteries in smoke detectors.  Immunization recommendations discussed-- yearly flu shots recommended, given today. *** Prevnar-20 *** COVID booster recommended, declined. Educated re: availability of non-mRNA vaccine at some pharmacies (Novavax). Consider RSV vaccine (>60, with asthma), from the pharmacy, if covered by her insurance. Colonoscopy recommendations reviewed--UTD   F/u 1 year, sooner prn

## 2024-07-17 ENCOUNTER — Encounter: Payer: 59 | Admitting: Family Medicine

## 2024-07-17 DIAGNOSIS — J45998 Other asthma: Secondary | ICD-10-CM

## 2024-07-17 DIAGNOSIS — E782 Mixed hyperlipidemia: Secondary | ICD-10-CM

## 2024-07-17 DIAGNOSIS — Z23 Encounter for immunization: Secondary | ICD-10-CM

## 2024-07-17 DIAGNOSIS — F411 Generalized anxiety disorder: Secondary | ICD-10-CM

## 2024-07-17 DIAGNOSIS — Z0000 Encounter for general adult medical examination without abnormal findings: Secondary | ICD-10-CM

## 2024-07-17 DIAGNOSIS — N952 Postmenopausal atrophic vaginitis: Secondary | ICD-10-CM

## 2024-07-17 DIAGNOSIS — E559 Vitamin D deficiency, unspecified: Secondary | ICD-10-CM

## 2024-08-07 ENCOUNTER — Encounter: Payer: Self-pay | Admitting: *Deleted

## 2024-08-11 ENCOUNTER — Encounter: Admitting: Family Medicine

## 2024-08-12 ENCOUNTER — Encounter: Payer: Self-pay | Admitting: *Deleted

## 2024-09-01 ENCOUNTER — Encounter: Payer: Self-pay | Admitting: Radiology

## 2024-10-15 LAB — HM MAMMOGRAPHY

## 2024-10-16 ENCOUNTER — Other Ambulatory Visit: Payer: Self-pay | Admitting: Family Medicine

## 2024-10-16 DIAGNOSIS — F411 Generalized anxiety disorder: Secondary | ICD-10-CM

## 2024-10-22 ENCOUNTER — Encounter: Admitting: Family Medicine

## 2024-11-18 ENCOUNTER — Telehealth: Payer: Self-pay | Admitting: *Deleted

## 2024-11-18 DIAGNOSIS — E782 Mixed hyperlipidemia: Secondary | ICD-10-CM

## 2024-11-18 DIAGNOSIS — E559 Vitamin D deficiency, unspecified: Secondary | ICD-10-CM

## 2024-11-18 DIAGNOSIS — Z0000 Encounter for general adult medical examination without abnormal findings: Secondary | ICD-10-CM

## 2024-11-18 NOTE — Telephone Encounter (Signed)
 I ordered labs. I'd also like to order a TSH--unsure if insurance would cover if I just assign it to physical dx. See if she has any symptoms, then order (with symptoms, and associate with CPE). Last done in 2019.

## 2024-11-18 NOTE — Telephone Encounter (Signed)
 Copied from CRM [id number]. Topic: Clinical - Request for Lab/Test Order >> Nov 18, 2024  2:30 PM Hannah Werner wrote: Reason for CRM: Patient is requesting labs to be added for her physical on 02/11. Would like to come a few days before her appointment to have them done.  Patient can be reached at [phone]  I scheduled her for 12/08/24 for labs, need orders please.

## 2024-11-20 ENCOUNTER — Other Ambulatory Visit: Payer: Self-pay | Admitting: *Deleted

## 2024-11-20 ENCOUNTER — Encounter: Payer: Self-pay | Admitting: Family Medicine

## 2024-11-20 DIAGNOSIS — L853 Xerosis cutis: Secondary | ICD-10-CM

## 2024-11-20 DIAGNOSIS — R194 Change in bowel habit: Secondary | ICD-10-CM

## 2024-11-20 DIAGNOSIS — R5383 Other fatigue: Secondary | ICD-10-CM

## 2024-12-08 ENCOUNTER — Other Ambulatory Visit

## 2024-12-10 ENCOUNTER — Encounter: Admitting: Family Medicine
